# Patient Record
Sex: Female | Born: 1975 | Race: White | Hispanic: No | Marital: Married | Smoking: Never smoker
Health system: Southern US, Community
[De-identification: ages and names within clinical notes are randomized; demographics above are authoritative.]

## PROBLEM LIST (undated history)

## (undated) DIAGNOSIS — G43909 Migraine, unspecified, not intractable, without status migrainosus: Secondary | ICD-10-CM

## (undated) DIAGNOSIS — F419 Anxiety disorder, unspecified: Secondary | ICD-10-CM

## (undated) DIAGNOSIS — N809 Endometriosis, unspecified: Secondary | ICD-10-CM

## (undated) DIAGNOSIS — G8929 Other chronic pain: Secondary | ICD-10-CM

## (undated) DIAGNOSIS — N200 Calculus of kidney: Secondary | ICD-10-CM

## (undated) DIAGNOSIS — Z87898 Personal history of other specified conditions: Secondary | ICD-10-CM

## (undated) DIAGNOSIS — M797 Fibromyalgia: Secondary | ICD-10-CM

## (undated) DIAGNOSIS — M1990 Unspecified osteoarthritis, unspecified site: Secondary | ICD-10-CM

## (undated) HISTORY — PX: VAGINAL HYSTERECTOMY: SUR661

## (undated) HISTORY — DX: Other chronic pain: G89.29

## (undated) HISTORY — PX: APPENDECTOMY: SHX54

## (undated) HISTORY — PX: CHOLECYSTECTOMY: SHX55

## (undated) HISTORY — DX: Personal history of other specified conditions: Z87.898

---

## 1997-07-06 ENCOUNTER — Other Ambulatory Visit: Admission: RE | Admit: 1997-07-06 | Discharge: 1997-07-06 | Payer: Self-pay | Admitting: *Deleted

## 1998-12-06 ENCOUNTER — Emergency Department (HOSPITAL_COMMUNITY): Admission: EM | Admit: 1998-12-06 | Discharge: 1998-12-06 | Payer: Self-pay | Admitting: Emergency Medicine

## 1999-03-23 ENCOUNTER — Encounter: Payer: Self-pay | Admitting: Family Medicine

## 1999-03-23 ENCOUNTER — Encounter: Admission: RE | Admit: 1999-03-23 | Discharge: 1999-03-23 | Payer: Self-pay | Admitting: Family Medicine

## 1999-11-16 ENCOUNTER — Encounter: Admission: RE | Admit: 1999-11-16 | Discharge: 1999-11-16 | Payer: Self-pay | Admitting: Internal Medicine

## 1999-11-16 ENCOUNTER — Encounter: Payer: Self-pay | Admitting: Internal Medicine

## 1999-12-10 ENCOUNTER — Ambulatory Visit (HOSPITAL_COMMUNITY): Admission: RE | Admit: 1999-12-10 | Discharge: 1999-12-10 | Payer: Self-pay | Admitting: Gastroenterology

## 2000-06-29 ENCOUNTER — Emergency Department (HOSPITAL_COMMUNITY): Admission: EM | Admit: 2000-06-29 | Discharge: 2000-06-30 | Payer: Self-pay | Admitting: Emergency Medicine

## 2001-05-25 ENCOUNTER — Encounter: Admission: RE | Admit: 2001-05-25 | Discharge: 2001-05-25 | Payer: Self-pay | Admitting: Specialist

## 2001-05-25 ENCOUNTER — Encounter: Payer: Self-pay | Admitting: Specialist

## 2001-07-23 ENCOUNTER — Other Ambulatory Visit: Admission: RE | Admit: 2001-07-23 | Discharge: 2001-07-23 | Payer: Self-pay | Admitting: Obstetrics and Gynecology

## 2001-12-02 ENCOUNTER — Inpatient Hospital Stay (HOSPITAL_COMMUNITY): Admission: RE | Admit: 2001-12-02 | Discharge: 2001-12-03 | Payer: Self-pay | Admitting: Obstetrics and Gynecology

## 2001-12-02 ENCOUNTER — Encounter (INDEPENDENT_AMBULATORY_CARE_PROVIDER_SITE_OTHER): Payer: Self-pay

## 2001-12-07 ENCOUNTER — Inpatient Hospital Stay (HOSPITAL_COMMUNITY): Admission: AD | Admit: 2001-12-07 | Discharge: 2001-12-07 | Payer: Self-pay | Admitting: Obstetrics and Gynecology

## 2002-05-10 ENCOUNTER — Encounter: Payer: Self-pay | Admitting: Emergency Medicine

## 2002-05-10 ENCOUNTER — Emergency Department (HOSPITAL_COMMUNITY): Admission: EM | Admit: 2002-05-10 | Discharge: 2002-05-11 | Payer: Self-pay | Admitting: Emergency Medicine

## 2002-06-23 ENCOUNTER — Emergency Department (HOSPITAL_COMMUNITY): Admission: EM | Admit: 2002-06-23 | Discharge: 2002-06-23 | Payer: Self-pay | Admitting: Internal Medicine

## 2002-06-23 ENCOUNTER — Encounter: Payer: Self-pay | Admitting: Internal Medicine

## 2002-11-19 ENCOUNTER — Emergency Department (HOSPITAL_COMMUNITY): Admission: EM | Admit: 2002-11-19 | Discharge: 2002-11-19 | Payer: Self-pay | Admitting: Emergency Medicine

## 2002-11-19 ENCOUNTER — Encounter: Payer: Self-pay | Admitting: Emergency Medicine

## 2003-04-10 ENCOUNTER — Encounter: Admission: RE | Admit: 2003-04-10 | Discharge: 2003-04-10 | Payer: Self-pay | Admitting: Family Medicine

## 2003-05-02 ENCOUNTER — Inpatient Hospital Stay (HOSPITAL_COMMUNITY): Admission: EM | Admit: 2003-05-02 | Discharge: 2003-05-05 | Payer: Self-pay | Admitting: Emergency Medicine

## 2003-05-08 ENCOUNTER — Emergency Department (HOSPITAL_COMMUNITY): Admission: EM | Admit: 2003-05-08 | Discharge: 2003-05-08 | Payer: Self-pay | Admitting: Emergency Medicine

## 2003-07-06 ENCOUNTER — Other Ambulatory Visit: Admission: RE | Admit: 2003-07-06 | Discharge: 2003-07-06 | Payer: Self-pay | Admitting: Obstetrics and Gynecology

## 2003-08-04 ENCOUNTER — Emergency Department (HOSPITAL_COMMUNITY): Admission: EM | Admit: 2003-08-04 | Discharge: 2003-08-04 | Payer: Self-pay | Admitting: Emergency Medicine

## 2004-03-23 ENCOUNTER — Ambulatory Visit (HOSPITAL_COMMUNITY): Admission: RE | Admit: 2004-03-23 | Discharge: 2004-03-23 | Payer: Self-pay | Admitting: Family Medicine

## 2004-04-04 ENCOUNTER — Ambulatory Visit: Payer: Self-pay | Admitting: Orthopedic Surgery

## 2004-04-13 ENCOUNTER — Encounter (HOSPITAL_COMMUNITY): Admission: RE | Admit: 2004-04-13 | Discharge: 2004-05-13 | Payer: Self-pay | Admitting: Orthopedic Surgery

## 2004-12-10 ENCOUNTER — Ambulatory Visit (HOSPITAL_COMMUNITY): Admission: RE | Admit: 2004-12-10 | Discharge: 2004-12-10 | Payer: Self-pay | Admitting: Family Medicine

## 2004-12-12 ENCOUNTER — Ambulatory Visit (HOSPITAL_COMMUNITY): Admission: RE | Admit: 2004-12-12 | Discharge: 2004-12-12 | Payer: Self-pay | Admitting: Family Medicine

## 2004-12-20 ENCOUNTER — Ambulatory Visit: Payer: Self-pay | Admitting: Internal Medicine

## 2004-12-21 ENCOUNTER — Ambulatory Visit: Payer: Self-pay | Admitting: Internal Medicine

## 2004-12-21 ENCOUNTER — Ambulatory Visit (HOSPITAL_COMMUNITY): Admission: RE | Admit: 2004-12-21 | Discharge: 2004-12-21 | Payer: Self-pay | Admitting: Obstetrics and Gynecology

## 2005-02-13 ENCOUNTER — Ambulatory Visit: Payer: Self-pay | Admitting: Internal Medicine

## 2005-05-01 ENCOUNTER — Other Ambulatory Visit: Admission: RE | Admit: 2005-05-01 | Discharge: 2005-05-01 | Payer: Self-pay | Admitting: Obstetrics and Gynecology

## 2005-05-17 ENCOUNTER — Encounter: Admission: RE | Admit: 2005-05-17 | Discharge: 2005-05-17 | Payer: Self-pay | Admitting: Obstetrics and Gynecology

## 2005-08-02 ENCOUNTER — Emergency Department (HOSPITAL_COMMUNITY): Admission: EM | Admit: 2005-08-02 | Discharge: 2005-08-03 | Payer: Self-pay | Admitting: Emergency Medicine

## 2006-08-27 ENCOUNTER — Ambulatory Visit (HOSPITAL_COMMUNITY): Admission: RE | Admit: 2006-08-27 | Discharge: 2006-08-27 | Payer: Self-pay | Admitting: Family Medicine

## 2007-02-11 ENCOUNTER — Emergency Department (HOSPITAL_COMMUNITY): Admission: EM | Admit: 2007-02-11 | Discharge: 2007-02-11 | Payer: Self-pay | Admitting: Emergency Medicine

## 2007-02-25 ENCOUNTER — Emergency Department (HOSPITAL_COMMUNITY): Admission: EM | Admit: 2007-02-25 | Discharge: 2007-02-25 | Payer: Self-pay | Admitting: Emergency Medicine

## 2007-08-14 ENCOUNTER — Emergency Department (HOSPITAL_COMMUNITY): Admission: EM | Admit: 2007-08-14 | Discharge: 2007-08-14 | Payer: Self-pay | Admitting: Emergency Medicine

## 2007-10-15 ENCOUNTER — Emergency Department (HOSPITAL_COMMUNITY): Admission: EM | Admit: 2007-10-15 | Discharge: 2007-10-15 | Payer: Self-pay | Admitting: Emergency Medicine

## 2007-10-16 ENCOUNTER — Ambulatory Visit: Payer: Self-pay | Admitting: Internal Medicine

## 2007-10-17 ENCOUNTER — Emergency Department (HOSPITAL_COMMUNITY): Admission: EM | Admit: 2007-10-17 | Discharge: 2007-10-17 | Payer: Self-pay | Admitting: Emergency Medicine

## 2007-10-19 ENCOUNTER — Inpatient Hospital Stay (HOSPITAL_COMMUNITY): Admission: EM | Admit: 2007-10-19 | Discharge: 2007-10-22 | Payer: Self-pay | Admitting: Emergency Medicine

## 2007-10-20 ENCOUNTER — Ambulatory Visit: Payer: Self-pay | Admitting: Gastroenterology

## 2007-10-21 ENCOUNTER — Encounter (INDEPENDENT_AMBULATORY_CARE_PROVIDER_SITE_OTHER): Payer: Self-pay | Admitting: General Surgery

## 2008-05-12 ENCOUNTER — Ambulatory Visit (HOSPITAL_COMMUNITY): Admission: RE | Admit: 2008-05-12 | Discharge: 2008-05-12 | Payer: Self-pay | Admitting: Family Medicine

## 2008-10-29 ENCOUNTER — Emergency Department (HOSPITAL_COMMUNITY): Admission: EM | Admit: 2008-10-29 | Discharge: 2008-10-29 | Payer: Self-pay | Admitting: Family Medicine

## 2008-11-29 IMAGING — NM NM HEPATO W/GB/PHARM/[PERSON_NAME]
2 series · 12 of 12 positions shown · non-contrast
Comparison: CT 10/17/2007

CLINICAL DATA: Abdominal pain

NUCLEAR MEDICINE HEPATOBILIARY IMAGING WITH GALLBLADDER EF
TECHNIQUE: Sequential images of the abdomen were obtained [DATE] minutes following intravenous administration of
radiopharmaceutical. After oral ingestion of 8oz of half and half
cream, gallbladder ejection fraction was determined.
Radiopharmaceutical:  5 mCi technetium Choletec.mCi Fc-00m Choletec

[Series 1: gb hepatobiliary scan · 3.27mm/px · 6 of 60 frames shown (1 of 2)]
[frame 6/60]
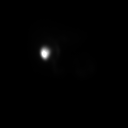
[frame 16/60]
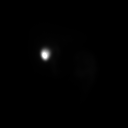
[frame 26/60]
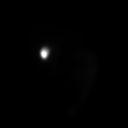
[frame 36/60]
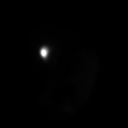
[frame 46/60]
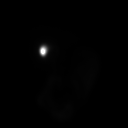
[frame 56/60]
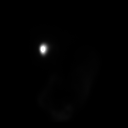

[Series 1: gb hepatobiliary scan · 3.27mm/px · 6 of 52 frames shown (2 of 2)]
[frame 5/52]
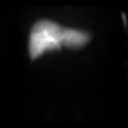
[frame 13/52]
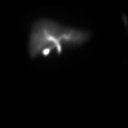
[frame 22/52]
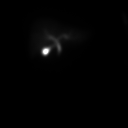
[frame 31/52]
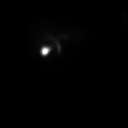
[frame 39/52]
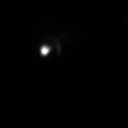
[frame 48/52]
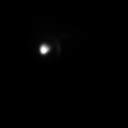

[12 of 12 positions shown; findings below may reference images not displayed]

FINDINGS: There is prompt uptake and excretion of radiotracer by
the liver.  Activity is seen in the gallbladder at 10 minutes.
Small bowel activity noted prior to half-and-half administration.
After administration of half-and-half, estimated gallbladder
ejection fraction is 21.8%.  Normal is greater than 50%.
IMPRESSION: No evidence of cystic duct or common bile duct obstruction.

Significantly diminished gallbladder ejection fraction.

## 2010-03-01 ENCOUNTER — Inpatient Hospital Stay (HOSPITAL_COMMUNITY)
Admission: RE | Admit: 2010-03-01 | Discharge: 2010-03-03 | Payer: Self-pay | Source: Home / Self Care | Attending: Psychiatry | Admitting: Psychiatry

## 2010-04-01 ENCOUNTER — Encounter: Payer: Self-pay | Admitting: Family Medicine

## 2010-04-02 ENCOUNTER — Encounter: Payer: Self-pay | Admitting: Internal Medicine

## 2010-04-02 ENCOUNTER — Encounter: Payer: Self-pay | Admitting: Physical Medicine and Rehabilitation

## 2010-05-21 LAB — COMPREHENSIVE METABOLIC PANEL
ALT: 14 U/L (ref 0–35)
AST: 13 U/L (ref 0–37)
Albumin: 3.9 g/dL (ref 3.5–5.2)
Alkaline Phosphatase: 42 U/L (ref 39–117)
BUN: 13 mg/dL (ref 6–23)
CO2: 27 mEq/L (ref 19–32)
Calcium: 9.4 mg/dL (ref 8.4–10.5)
Chloride: 105 mEq/L (ref 96–112)
Creatinine, Ser: 0.72 mg/dL (ref 0.4–1.2)
GFR calc Af Amer: >60 mL/min (ref >60)
GFR calc non Af Amer: >60 mL/min (ref >60)
Glucose, Bld: 85 mg/dL (ref 70–99)
Potassium: 4.4 mEq/L (ref 3.5–5.1)
Sodium: 140 mEq/L (ref 135–145)
Total Bilirubin: 0.5 mg/dL (ref 0.3–1.2)
Total Protein: 6.6 g/dL (ref 6.0–8.3)

## 2010-05-21 LAB — CBC
HCT: 40.9 % (ref 36.0–46.0)
Hemoglobin: 13.8 g/dL (ref 12.0–15.0)
MCH: 30.3 pg (ref 26.0–34.0)
MCHC: 33.7 g/dL (ref 30.0–36.0)
MCV: 89.7 fL (ref 78.0–100.0)
Platelets: 255 10*3/uL (ref 150–400)
RBC: 4.56 MIL/uL (ref 3.87–5.11)
RDW: 11.9 % (ref 11.5–15.5)
WBC: 9.9 10*3/uL (ref 4.0–10.5)

## 2010-05-21 LAB — TSH: TSH: 3.71 u[IU]/mL (ref 0.350–4.500)

## 2010-07-24 NOTE — Op Note (Signed)
NAMEZALIYAH, MEIKLE NO.:  [id number]   MEDICAL RECORD NO.:  [id number]          PATIENT TYPE:  INP   LOCATION:  A312                          FACILITY:  [facility]   PHYSICIAN:  Barbaraann Barthel, M.D. DATE OF BIRTH:  11/29/1975   DATE OF PROCEDURE:  10/21/2007  DATE OF DISCHARGE:                               OPERATIVE REPORT   SURGEON:  Barbaraann Barthel, MD   PREOPERATIVE DIAGNOSIS:  Cholecystitis secondary to cholelithiasis.   POSTOPERATIVE DIAGNOSIS:  Cholecystitis secondary to cholelithiasis.   PROCEDURE:  Laparoscopic cholecystectomy.   SPECIMEN:  Gallbladder.   NOTE:  This is a 35 year old white female who had recurrent episodes of  right upper quadrant pain postprandial in nature accompanied with  nausea, vomiting, and bloating.  She was seen in the emergency room on 3  different occasions.  We discussed surgery for her after she was  admitted to the Medical Service and she had also had a GI consult.  We  discussed surgery in detail discussing complications not limited to, but  including bleeding, infection, damage to bile ducts, perforation of  organs, transitory diarrhea, and the possibility that an open procedure  might be required.  Informed consent was obtained.   GROSS OPERATIVE FINDINGS:  Those consistent with a mildly inflamed  gallbladder with some edema, a normal size cystic duct which was not  cannulated, and the rest of the right upper quadrant grossly appeared  normal laparoscopically.   TECHNIQUE:  The patient was placed in supine position after the adequate  administration of general anesthesia via endotracheal intubation.  Her  entire abdomen was prepped with Betadine solution and draped in usual  manner.  Prior to this, a Foley catheter was aseptically inserted.  After prepping and draping a periumbilical incision was carried out over  the superior aspect of the umbilicus.  The patient was placed in the  Trendelenburg position.  The  fascia was grasped with a sharp towel clip  and elevated.  A Veress needle was inserted and confirmed the position  with a saline drop test and the abdomen was then insufflated with  approximately 3.5 liters of CO2.  Using a 11-mm cannula and Visiport  technique, the cannula was placed in the area of the umbilicus and then  under direct vision, a 11 mm cannula was placed in the epigastrium and  two 5 mm cannulas in right upper quadrant laterally.  The gallbladder  was grasped.  Its adhesions were taken down.  The cystic duct was  clearly visualized, triply silver clipped on the side of the common bile  duct, singly silver clipped on the side of the gallbladder, and divided  as was the cystic artery.  There was no bleeding or leaking.  The  gallbladder was then grasped with further traction and removed from the  liver bed using the hook cautery device.  There was minimal oozing.  The  liver bed was cauterized.  We then removed the gallbladder through the  epigastric incision using the Endosac device.  We then irrigated,  checked for bleeding, and then I elected to leave a piece  of Surgicel on  the liver bed and a drain which was exited from the lateral-most  incision.  We then desufflated the abdomen, closed the fascia in the  area of the umbilicus, and the epigastrium using O Polysorb and then  closed all skin incisions with a surgical clips.  We used 0.5%  Sensorcaine at all port sites for postoperative comfort.  The drain was  sutured in place with 3-0 nylon and prior to closure, all sponge,  needle, and instrument counts found to be correct.   ESTIMATED BLOOD LOSS:  Minimal.   The patient received 800 mL of crystalloids intraoperatively.  Wound  designation was cleaned, contaminated, and there was no spillage of  bile.      Barbaraann Barthel, M.D.  Electronically Signed     WB/MEDQ  D:  10/21/2007  T:  10/22/2007  Job:  [id number]   cc:   [facility] Team   GI Service

## 2010-07-24 NOTE — Consult Note (Signed)
NAMESAADIA, DEWITT NO.:  [id number]   MEDICAL RECORD NO.:  [id number]          PATIENT TYPE:  INP   LOCATION:  A312                          FACILITY:  [facility]   PHYSICIAN:  Barbaraann Barthel, M.D. DATE OF BIRTH:  1975-05-25   DATE OF CONSULTATION:  10/20/2007  DATE OF DISCHARGE:                                 CONSULTATION   Surgery was asked to see his 35 year old white female for recurrent  abdominal pain.   CHIEF COMPLAINT:  Epigastric and right upper quadrant pain with nausea  and vomiting.   HISTORY OF PRESENT MEDICAL ILLNESS:  The patient states that she has had  these symptoms on and off for the last several years with the most  recent flare up being this weekend after eating greasy food.  She,  during this weekend, had three visits to the emergency room, the last  which she was admitted to [facility].  Surgery was consulted  after being seen by the GI service.  The patient had a sonogram which  did not reveal stones; however, she had a hepatobiliary scan which  showed a 20% ejection fraction with recurrence of her symptoms.  Surgery  was asked to see regarding laparoscopic cholecystectomy for biliary  dyskinesia.   PHYSICAL EXAMINATION:  GENERAL:  A pleasant 35 year old white female who  is in no acute distress.  She is uncomfortable but otherwise doing well.  VITAL SIGNS:  She is afebrile with a temperature of 97.9, blood pressure  is 112/69, heart rate 73, respirations are 20.  HEENT:  Head is normocephalic.  Eyes - extraocular movements are intact.  Pupils are round and react to light and accommodation.  There is no  conjunctive pallor or scleral injection.  Sclerae is normal tincture.  Nose and oral mucosa are moist.  NECK:  Supple and cylindrical without jugular vein distention,  thyromegaly, tracheal deviation or cervical adenopathy.  No bruits are  auscultated.  CHEST:  Clear, both to anterior and posterior auscultation.  HEART:   Regular rhythm.  BREASTS/AXILLA:  Without masses.  ABDOMEN:  The patient is tender in the epigastrium and in the right  upper quadrant.  She has a feeling of some bloating.  No masses are  appreciated.  RECTAL:  Guaiac-negative stool.  No inguinal hernias are appreciated.  EXTREMITIES:  The patient has had previous left cubital nerve release at  her left elbow.   REVIEW OF SYSTEMS:  GI SYMPTOMS:  The patient has nausea, vomiting and  recurrent epigastric pain that has not been improved by the use of  antacids.  Sonogram revealed no stones.  It was otherwise normal.  Hepatobiliary scan showed a diminished ejection fraction with recurrence  of her symptoms, and the CT scan is also grossly within normal limits;  she has had a recent CT scan.  She has no past history of hepatitis.  She may have had a history of irritable bowel syndrome, although it does  not sound like this.  She had no history of diarrhea.  She is more  chronically constipated.  She has had no bright red  rectal bleeding,  black tarry stools or unexplained weight loss.  She has had an upper GI  endoscopy in the past.  GU SYSTEM:  She says she may have had been  diagnosed with kidney stones in the past.  However, she has no dysuria  at present.  ENDOCRINE SYSTEM:  No history of diabetes or thyroid  disease.  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  A questionable  history of some fibromyalgia.  NEUROLOGICAL SYSTEM:  Grossly within  normal limits on examination.  The patient has a history of seizure  disorders, the cause of which were never determined.  She has not had a  seizure in the last 5 years.  She also has a history of migraines.  CARDIOVASCULAR SYSTEM:  Grossly within normal limits.  The patient does  not smoke or drink.   OBSTETRIC AND GYNECOLOGIC HISTORY:  She is a gravida 2, para 2, abortus  zero, cesarean zero female.  No family history of  breast carcinoma and a  mammogram performed a year ago which was normal.  She has had a vaginal  hysterectomy.   MEDICATIONS:  1. Keppra 2000 mg b.i.d.  2. Lyrica 75 mg t.i.d.  (She was taking AcipHex earlier daily.)  3. Imitrex 100 mg b.i.d.  4. Xanax extended release daily as needed.  5. Protonix 40 mg daily.  6. Bentyl t.i.d. as needed.  7. Align daily.   ALLERGIES:  She states that she is allergic to:  1. LATEX.  2. MORPHINE.  3. CODEINE.  4. STADOL.  5. DILAUDID.   IMPRESSION:  Ms. 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  Her symptoms have  certainly been long term and recurrent in this aspect.  She has other  somatic problems as mentioned above; however, I believe that she is  having this problem.  We discussed surgery with her in detail,  discussing complications, not limited to but including, bleeding,  infection, damage to bile ducts, perforation of organs and transitory  diarrhea, and the possibility that open surgery might be required.  We  also discussed that the results for cholecystectomy for biliary  dyskinesia are not as satisfying as for the results of cholecystectomy  for cholelithiasis.  Informed consent was obtained.  I also stated the  patient's family could call me, and I gave them my card should they have  any questions.  We will plan for surgery in the morning, and I have  discussed this with the medical service and, following agreement, we  will plan for surgery in the a.m.   REST OF LABORATORY DATA:  The patient has a white count of 7.3 with an  hemoglobin and hematocrit of 11.4 and 34.2 with 198,000 platelets,  normal differential.  Blood sugars are 85, BUN is 9 with a creatinine of  0.72.  Liver function studies and amylase are within normal limits.   PAST SURGICAL HISTORY:  1. Left elbow surgery for nerve entrapment.  2. Vaginal hysterectomy.  3. Appendectomy.   We will plan  for surgery in the morning if all are in agreement.   Thank you for the consultation.      Barbaraann Barthel, M.D.  Electronically Signed     WB/MEDQ  D:  10/20/2007  T:  10/20/2007  Job:  [id number]   cc:   [facility] Team

## 2010-07-24 NOTE — Discharge Summary (Signed)
[name], KAIN              ACCOUNT NO.:  [id number]   MEDICAL RECORD NO.:  [id number]          PATIENT TYPE:  INP   LOCATION:  A312                          FACILITY:  [facility]   PHYSICIAN:  Osvaldo Shipper, MD     DATE OF BIRTH:  Apr 10, 1975   DATE OF ADMISSION:  10/19/2007  DATE OF DISCHARGE:  08/13/2009LH                               DISCHARGE SUMMARY   HISTORY OF PRESENT ILLNESS:  Please review history and physical dictated  by Dr. Skeet Latch for details regarding the patient's presenting  illness.   PRIMARY CARE PHYSICIAN:  [facility].   DISCHARGE DIAGNOSES:  1. Abdominal pain, likely secondary to biliary dyskinesia status post      cholecystectomy.  2. History of seizure disorder, stable.  3. History of fibromyalgia, stable.  4. History of migraines and panic attacks, stable.   HOSPITAL COURSE:  Briefly, this is a 35 year old Caucasian female who  presented with complaints of abdominal pain.  The patient was seen in  the emergency department and she underwent  initially an ultrasound of  the abdomen which did not show any abnormalities.  She subsequently  underwent a CT scan of the abdomen and pelvis which showed a prominence  CBD, but no other acute findings.  A CT scan of the pelvis did show a  small ovarian cyst.  The patient's liver function tests were within the  normal range.  The patient subsequently underwent a HIDA scan which  showed significantly diminished gallbladder EF of 21%.  Based on these  findings, general surgery was consulted and the patient underwent  laparoscopic cholecystectomy on October 21, 2007.  During the surgery,  gallbladder was noted to be mildly inflamed with some edema.  The  patient has done well postoperatively.  This morning Dr. Malvin Johns saw  her and the wounds appeared to be quite clean with no evidence of  infection.  She did have a slight temperature this morning at 99.4 and  on recheck was 99.8.  The patient is  denying any other complaints apart  from mild abdominal discomfort.  No cough is present. No urinary  discomfort is present.  The rest of her medical issues are quite stable  with no seizure activity.   Apart from the abnormal temperature,  no other significant abnormalities  are present.  I did not palpate the abdomen as the patient refused and  she was just seen a few minutes ago by Dr. Malvin Johns who removed the JP  drain.  He has cleared her for discharge.  I have explained to the  patient that she can take Tylenol as needed for slightly elevated  temperatures.  I have told her that if she experiences chills or if the  temperature goes above 101 and not easily controlled with Tylenol she  needs to seek attention immediately.   DISCHARGE MEDICATIONS:  1. Darvocet-N 100 one tablet q.4-6h. as needed.  Dr. Malvin Johns has      written a prescription for #20 tablets.  2. Tylenol as needed, not to exceed 3 to 4 pills every day.  3. Continue with  Keppra 2,000 mg b.i.d.  4. Lyrica 75 mg t.i.d.  5. Imitrex 100 mg b.i.d.  6. Xanax XR daily as needed.  7. Protonix 40 mg daily.  8. Bentyl as needed.  9. Align, she was taking this four times a day.   No aspirin products.   FOLLOWUP:  Follow up with Dr. Malvin Johns on November 02, 2007 at 3:30 P.M.  Followup with her PMD as needed.  Other instructions as above.   DIET:  Full liquids or soft diet for the next few days and then she may  advance as tolerated.   WOUND CARE:  Wound care was instructed by Dr. Malvin Johns.   Total time of this discharge encounter about 35 minutes.      Osvaldo Shipper, MD  Electronically Signed     GK/MEDQ  D:  10/22/2007  T:  10/22/2007  Job:  [id number]   cc:   Barbaraann Barthel, M.D.  Fax: [phone]   Patrica Duel, M.D.  Fax: [phone]

## 2010-07-24 NOTE — Consult Note (Signed)
NAME:  [name], [name]              ACCOUNT NO.:  [id number]   MEDICAL RECORD NO.:  [id number]          PATIENT TYPE:  INP   LOCATION:  A312                          FACILITY:  [facility]   PHYSICIAN:  Kassie Mends, M.D.      DATE OF BIRTH:  1976/03/09   DATE OF CONSULTATION:  DATE OF DISCHARGE:                                 CONSULTATION   REQUESTING PHYSICIAN:  [facility], P Team   REASON FOR CONSULTATION:  Right upper quadrant epigastric pain, nausea,  vomiting, and diarrhea.   HISTORY OF PRESENT ILLNESS:  [name] is a 36 year old Caucasian  female.  She has had a 6-day history of abdominal pain, nausea, and  vomiting.  She was actually seen by me in consultation on October 16, 2007.  She had previously been to the emergency room prior to our  appointment.  She has also been to the emergency room twice since the  appointment and if you please refer to my October 16, 2007, note.  She  continued to have severe pain that is usually worse postprandially.  It  is mostly in the epigastrium and right upper quadrant.  It does radiate  through to the back.  She complains of severe bloating when she eats.  She also has had intermittent diarrhea anywhere from 1-3 loose stools  per day, it has been nonbloody recently, but she does have history of  hematochezia previously.  Since consultation with me, she was set up to  have a HIDA scan, but the pain became so severe she reported back to the  emergency room, and she was admitted to the hospital.  Subsequently,  underwent a HIDA scan which showed a gallbladder ejection fraction of  21.8%.  She did have reproduction of symptoms with Half-and-Half, which  included severe excruciating epigastric right upper quadrant pain which  radiate to her back and right shoulder as well as severe nausea.  She  does have underlying history of IBS, and we will need a colonoscopy for  hematochezia at some point.  She tells me her symptoms are worse with  greasy  foods.  She has noted some heartburn and indigestion recently, as  well workup thus far have included an abdominal ultrasound which showed  mildly prominent CBD.  There were no intrahepatic biliary dilatation was  identified.  She had a normal gallbladder without stones or wall  thickening on this study.  She had a CT of the abdomen and pelvis with  contrast on October 17, 2007, which showed prominent CBD which was stable  compared to previous CT from May 2007 and a 2.5 cm left ovarian cyst.  She had stool studies through our office as ova, parasites, and culture  and sensitivity are pending.  She had a negative C. diff, negative fecal  occult blood test, and a positive lactoferrin.   PAST MEDICAL/SURGICAL HISTORY:  1. IBS.  2. Fibromyalgia.  3. Seizure disorder, her last seizure was about 4 years ago.  4. Anxiety.  5. Migraine headaches.  6. Hot flashes.  7. Partial hysterectomy.  8. Appendectomy.  9. Left elbow  surgery.   MEDICATIONS:  Prior to admission;  1. Keppra 2000 mg b.i.d.  2. Lyrica 25 mg daily.  3. Imitrex 100 mg b.i.d.  4. Xanax XR unknown dose daily.  5. Protonix 40 mg daily.  6. Bentyl 10 mg t.i.d. p.r.n.  7. Alinia once daily.   ALLERGIES:  1. MORPHINE.  2. CODEINE.  3. STADOL.  4. LATEX.  5. SEPTRA.  6. DILAUDID.   FAMILY HISTORY:  Father had pancreatic carcinoma.  He had cirrhosis as  well as alcoholic.  He died at age 54.  Her mother is age 87 and  healthy.  She is an only child.   SOCIAL HISTORY:  She is married.  She has a 63 and 37 year old at home.  She works as a Audiological scientist.  She has never been a smoker, drink  any heavy alcohol, or used any drugs.   REVIEW OF SYSTEMS:  See HPI, otherwise negative.   PHYSICAL EXAMINATION:  VITAL SIGNS:  Weight 55 kg, height 63 inches,  temp 97.9, pulse 73, respirations 20, blood pressure 112/69, and O2 sat  98% on room air.GENERAL:  She is a well-developed, well-nourished  Caucasian female in no acute  distress.HEENT:  Sclerae clear, nonicteric.  Conjunctivae pink.  Oropharynx pink and moist without any lesions.NECK:  Supple without mass or thyromegaly.  CHEST:  Heart regular rate and rhythm.  Normal S1 and S2 without  murmurs, clicks, rubs, or gallop.LUNGS:  Clear to auscultation  bilaterally.ABDOMEN:  Positive bowel sounds x4.  No bruits auscultated.  Soft and nondistended.  She does have Murphy's point tenderness.  She  has significant right upper quadrant tenderness and tenderness around  the epigastrium on deep palpation.  There is no rebound tenderness or  guarding.  No hepatosplenomegaly or mass.EXTREMITIES:  Without clubbing  or edema.RECTAL:  Shows some external hemorrhoids as well as palpable  internal hemorrhoids.  There is no internal masses palpated, small  amount of hemoccult negative, and brown stool was obtained from the  vault.EXTREMITIES:  Without clubbing or edema.   LABORATORY DATA:  WBC, 7.3, hemoglobin 11.4, hematocrit 34.2, and  platelets 198.  Calcium 8.3, sodium 140, potassium 3.4, chloride 111,  CO2 of 24, BUN 9, creatinine 0.72, and glucose 85.  Urinalysis, positive  for glucose.   IMPRESSION:  [name] is 35 year old female with acute  epigastric/right upper quadrant pain and is postprandial.  HIDA scan was  positive.  It does reproduce her pain and nausea.  I suspect chronic  acalculous gallbladder disease is culprit of her pain, does not explain  diarrhea, however, or hematochezia, so the patient will need colonoscopy  +/- EGD and a later date if she remains with nausea.  She does have  underlying irritable bowel syndrome and she has a mild anemia, which I  suspect is delusional given her IV fluids.   PLAN:  1. Agree with surgical consult for cholecystectomy.  2. Agree with pain control/antiemetics.  3. Remain on PPI.  4. Will follow up stool studies.   I would like to thank the [facility] P Team for allowing [place] to  participate in the care of Ms.  Case.      [name], N.P.      Kassie Mends, M.D.  Electronically Signed    KJ/MEDQ  D:  10/20/2007  T:  10/20/2007  Job:  [id number]   cc:   Patrica Duel, M.D.  Fax: [phone]

## 2010-07-24 NOTE — Consult Note (Signed)
[name], [name]              ACCOUNT NO.:  [id number]   MEDICAL RECORD NO.:  [id number]          PATIENT TYPE:  EMS   LOCATION:  ED                            FACILITY:  [facility]   PHYSICIAN:  [name], N.P.    DATE OF BIRTH:  Apr 02, 1975   DATE OF CONSULTATION:  DATE OF DISCHARGE:  10/15/2007                                 CONSULTATION   REQUESTING PHYSICIAN:  Kelsey Lerner. Margretta Ditty, MD   PRIMARY CARE PHYSICIAN:  Kelsey Duel, MD   CHIEF COMPLAINT:  Abdominal pain and chronic diarrhea and hematochezia.   HISTORY OF PRESENT ILLNESS:  [name] is a 35 year old Caucasian  female.  She has had a bout of severe epigastric pain, it started 2 days  ago in the evening when she was at work.  She described the pain  initially was a hunger pain, it became more and more severe.  Pain was  rated at 10/10 on pain scale.  She presented to the emergency department  on October 15, 2007.  She had abdominal ultrasound, which showed mildly  prominent common bile duct though no intrahepatic biliary dilatation  identified.  She had a normal gallbladder without stones or wall  thickening.  She had a normal UA, CBC, negative HCG, MET-7, and LFTs.  She has continued to have epigastric pain.  She is taking Darvocet as  needed for pain control.  She has been using a heating pad.  She was on  Protonix years ago.  Approximately 5 months ago, she was treated for a  bacterial infection as she and everyone in the household had diarrhea.  She was given 10 days worth of Flagyl.  She continued to have anywhere  between 6 and 8 bowel movements daily that are loose and watery.  She  has at times seen bright red blood in small to moderate amounts with  wiping.  She feels this is attributed to her hemorrhoids.  She denies  any melena.  Denies any fever.  Denies any chills.  She tells me she has  vomiting every day.  Her epigastric pain and vomiting is made worse  after she eats, she tells me, I always acid in my  stomach.  She  complains of water brash throughout the day.  She tells me she had an  EGD by Dr. Kinnie Case approximately 5 or 6 years ago, which was benign per  her report.  She has never had a colonoscopy.  She reports 11-pound  weight loss due to anorexia and all of her symptoms.  She tells me she  is afraid to eat in the past 6 months or so.  She denies any NSAID use.   PAST MEDICAL AND SURGICAL HISTORY:  Fibromyalgia, seizure disorder with  last seizure about 4 years ago, anxiety, migraine headaches, hot  flashes.  She has been told she had IBS, it was mostly constipation  predominant up until this point.  She has had a partial hysterectomy.  She had appendectomy and left elbow surgery.   CURRENT MEDICATIONS:  Keppra 1 g in the morning, 1 g in the evening,  Xanax XR unknown dose in the morning, Imitrex unknown dose p.r.n.,  Loestrin once daily, Lyrica 25 mg daily, vitamin D 500 units weekly, and  Darvocet-N 100 p.r.n.   ALLERGIES:  CODEINE, MORPHINE, and LATEX.   FAMILY HISTORY:  There is a family history of her father having  pancreatic cancer.  He also had cirrhosis and was an alcoholic.  He died  at age 54.  Her mother age 52 is healthy.  She was an only child.   SOCIAL HISTORY:  [name] is married.  She has an 39 and 37 year old  at home.  She works as a Audiological scientist.  She has never been a smoker  , drank any alcohol, or used drugs.   REVIEW OF SYSTEMS:  See HPI, otherwise negative.   PHYSICAL EXAM:  VITAL SIGNS:  Weight 124 pounds, height 62 inches,  temperature 98.2, blood pressure 110/70, and pulse 80.  GENERAL:  She is a well-developed, well-nourished Caucasian female in no  acute distress.  HEENT:  Sclerae clear, nonicteric.  Conjunctivae pink.  Oropharynx pink  and moist without any lesions.  NECK:  Supple without mass or thyromegaly.  CHEST:  Heart regular rhythm.  Normal S1 and S2.  LUNGS:  Clear to auscultation bilaterally.  ABDOMEN:  Positive bowel  sounds x4.  No bruits auscultated.  She does  have tenderness to the epigastrium and right upper quadrant on deep  palpation.  There is no rebound tenderness or guarding.  No  hepatosplenomegaly or mass.  EXTREMITIES:  Without clubbing or edema bilaterally.   IMPRESSION:  [name] is a 35 year old Caucasian female with a  myriad of GI concerns today.  Of most importance is figuring out the  etiology of her significant 48-hour right upper quadrant pain, nausea,  and vomiting which she may be due to gallbladder disease and warrants  further investigation.  She has had a underlying history of fibromyalgia  and irritable bowel syndrome.  Recent bacterial infection may have  caused post infectious irritable bowel syndrome; however, we cannot rule  out inflammatory bowel disease given her hematochezia and ultimately I  feel that she needs a complete GI evaluation given the chronicity of her  diarrhea and the fact that she has blood in her stools and has daily  vomiting.   PLAN:  1. I would like her to begin a line probiotics once daily.  2. Aciphex 20 mg daily.  We have given her 2 boxes of samples and a      prescription for 30 with 2 refills.  3. Full set of stool studies to include C. diff, lactoferrin,      cultures, sensitivity, ova, parasites, and hemoccult stools x3.  4. HIDA scan as soon as possible.  5. We will call her with HIDA scan results and go from there.  6. She is going to need a colonoscopy given her history of chronic      diarrhea and hematochezia and we may schedule EGD at that time if      her daily nausea and vomiting has not resolved and is not explained      by gallbladder disease.   Thank you Dr. Margretta Case for allowing [place] to participate in the care of Ms.  Case.      [name], N.P.     KJ/MEDQ  D:  10/16/2007  T:  10/17/2007  Job:  [id number]   cc:   [name], M.D.  Fax: [phone]

## 2010-07-24 NOTE — H&P (Signed)
[name], ROBBS              ACCOUNT NO.:  [id number]   MEDICAL RECORD NO.:  [id number]          PATIENT TYPE:  INP   LOCATION:  A312                          FACILITY:  [facility]   PHYSICIAN:  Skeet Latch, DO    DATE OF BIRTH:  Sep 05, 1975   DATE OF ADMISSION:  10/19/2007  DATE OF DISCHARGE:  LH                              HISTORY & PHYSICAL   CHIEF COMPLAINT:  Abdominal pain.   HISTORY OF PRESENT ILLNESS:  This is a 35 year old Caucasian female who  presents with abdominal pain for approximately 5-7 days.  Apparently,  the patient was seen by gastroenterology for her abdominal pain.  A HIDA  scan was ordered, and her gallbladder was seen to be functioning at  21.8%.  Patient states that the pain started last Wednesday while at  work and was severe in nature.  Patient states that the pain was 9/10  and continues to have pain.  The patient has been seen in the emergency  room approximately two times over the last four days and was given  multiple pain medications with only slight relief.  Patient has been  having diarrhea for over a month at this time.  States that  gastroenterology did take stool samples.  She does not know the results.   PAST MEDICAL HISTORY:  1. Fibromyalgia.  2. Migraines.  3. Endometriosis.  4. Panic attack.  5. Seizure disorder.   PAST SURGICAL HISTORY:  Hysterectomy.   SOCIAL HISTORY:  No history of smoking, alcohol, or drug abuse.  Patient  lives with spouse.   DRUG ALLERGIES:  Include MORPHINE, CODEINE, DILAUDID, and STADOL.   CURRENT HOME MEDICATIONS:  1. Keppra 2000 mg twice daily.  2. Lyrica 75 mg 3 times a day.  3. Imitrex 100 mg twice daily as needed.  4. Xanax daily as needed.  5. Protonix 40 mg daily.  6. Bentyl 3 times a day as needed.  7. Align 4 times a day.  8. Zanaflex p.r.n.   REVIEW OF SYSTEMS:  Positive for abdominal pain with diarrhea.   PHYSICAL EXAMINATION:  VITAL SIGNS:  Temperature 98.3, pulse 91,  respirations 18.   She is satting 100% on room air.  GENERAL:  She is awake and alert.  Oriented x3.  She is well-developed,  well-nourished, well-hydrated, and slightly uncomfortable.  HEENT:  Head is normocephalic and atraumatic.  No scleral icterus.  PERRLA.  EOMI.  NECK:  Supple, soft, nontender, nondistended.  CARDIOVASCULAR:  Regular rate and rhythm.  No murmurs, rubs or gallops.  LUNGS:  Clear to auscultation bilaterally.  No rales, wheezes, or  rhonchi.  ABDOMEN:  Soft.  She has generalized tenderness.  Positive epigastric  tenderness and right lower quadrant tenderness on deep palpation.  No  rebound or guarding is noted.  Positive bowel sounds.  EXTREMITIES:  No clubbing, cyanosis or edema.  NEUROLOGIC:  Cranial nerves II-XII are grossly intact.   LABS:  There are no current labs at this time.   HIDA scan showed prompt uptake in the secretion of radiotracer by the  liver activity, seen in the gallbladder 10  minutes.  Small bowel  activity noted prior to half & half administration.  After  administration of half & half, estimated gallbladder ejection fraction  was 28.8%.  Normal is greater than 50%.  There is no evidence of cystic  duct or common bile duct obstruction.  Significantly diminished  gallbladder ejection fraction.   She did have a CT of her abdomen and pelvis done on October 17, 2007.  Her  abdomen showed no acute intra-abdominal abnormalities.  The common bile  duct was prominent but appeared stable compared to prior CT.   CT of her pelvis showed no significant abnormalities.  It did show a 25  mm left ovarian cyst.   ASSESSMENT/PLAN:  1. Abdominal pain with abnormal HIDA scan:  For this, patient will be      on a pain medication and will get a gastroenterology consult at      this time.  Unsure if patient may need her gallbladder removed.      Would defer to recommendations from gastroenterology at this time.  2. History of fibromyalgia:  Patient will be placed on her home       medications.  3. History of anxiety:  She also will  be placed on her home      medications.  4. Patient will continue being on a PPI daily.  As stated, will await      gastroenterology recommendations at this time.      Skeet Latch, DO  Electronically Signed     SM/MEDQ  D:  10/19/2007  T:  10/19/2007  Job:  [id number]

## 2010-07-24 NOTE — Group Therapy Note (Signed)
[name], BIRKEL              ACCOUNT NO.:  [id number]   MEDICAL RECORD NO.:  [id number]          PATIENT TYPE:  INP   LOCATION:  A312                          FACILITY:  [facility]   PHYSICIAN:  Dorris Singh, DO    DATE OF BIRTH:  1975-05-22   DATE OF PROCEDURE:  DATE OF DISCHARGE:                                 PROGRESS NOTE   SUBJECTIVE:  The patient is seen today resting comfortably in bed.  She  is scheduled for a cholecystectomy with possible laparoscopic or open  cholecystectomy.  The patient is pleased with this because of her  prevailing symptoms.  Currently today, she is doing fine.  She has no  complaints.   OBJECTIVE:  VITAL SIGNS:  Temperature 97.8, pulse 73, respirations 20,  blood pressure 129/82.  GENERAL:  The patient is a 35 year old Caucasian female who is well-  developed, well-nourished in no acute distress.  HEART:  Regular rate and rhythm.  S1-S2.  LUNGS:  Clear to auscultation bilaterally.  No rales, wheezes or  rhonchi.  ABDOMEN:  Soft and nontender.  In the lower quadrant there is positive  right upper quadrant tenderness.  EXTREMITIES:  Positive pulses.  No edema, ecchymosis or cyanosis.   LABORATORY DATA:  Her labs for this morning; white count of 7.0,  hemoglobin 11.0, hematocrit 32.4, platelet count 200.  Sodium 141,  potassium 4.1, chloride 113, CO2 of 26, glucose 106, BUN 4, creatinine  0.66.   ASSESSMENT/PLAN:  1. Abdominal pain, possible cholecystitis.  The patient is scheduled      for a laparoscopic cholecystectomy or open cholecystectomy today.      Await pending results from this.  2. Fibromyalgia and anxiety.  The patient will continue on her home      medications and will continue to monitor and change therapy as      necessary.      Dorris Singh, DO  Electronically Signed     CB/MEDQ  D:  10/21/2007  T:  10/21/2007  Job:  [id number]

## 2010-07-24 NOTE — Assessment & Plan Note (Signed)
[facility]                                 ON-CALL NOTE   NAME:BURNETTEQuyen, Cutsforth                     MRN:          [id number]  DATE:02/25/2007                            DOB:          06-14-1975    PRIMARY CARDIOLOGIST:  Dr. Tenny Craw.   PRIMARY CARE PHYSICIAN:  Dr. Phillips Odor.   SUMMARY/HISTORY:  Ms. Formosa is a 35 year old female who called the  morning of December 17 at approximately 6:40 a.m. complaining of diffuse  bilateral leg discomfort, arm discomfort, facial discomfort, dizziness.  She does not feel right.  She feels cold to the bone.  She continued  with various complaints.  She states that she saw a cardiologist in  [place] several years ago, but she cannot tell me who the doctor was  or specifically what the findings were, except that she saw the doctor  for consultation.   On review of E-chart in the archives for [facility], she was  evaluated by Dr. Tenny Craw in December of 2006 for question that an EKG  demonstrated a possible WPW.  However, on Dr. Odessa Fleming review, this was  nonspecific.  An event loop monitor demonstrated sinus rhythm despite  symptoms of heart racing, dizziness, palpitations, and fluttering.  Dr.  Graciela Husbands, on review, as well as Dr. Tenny Craw, did not think that there was the  need for any cardiac intervention or further pursuit of her  palpitations.   Ms. Siegmann goes on to state that her OB/GYN just recently placed her  on birth control pills and this was discontinued due to various  symptoms.   She was concerned that this might be a heart problem, blood clot, or  other possible problem.   I explained to Ms. Coleson that, over the phone with her various  multiple complaints, it was difficult to determine an etiology.  Given  her multiple range of complaints, I explained to her that I did not feel  that she was having a heart attack or definite cardiac issue at this  time.  However, I also explained to her that I  could not tell her she  was not having problems with blood clots given her recent birth control  prescription.  I suggested that she contact her primary care physician  or that she proceed to the emergency room for further evaluation.  She  went on to relate more information to me that she has been seen in the  emergency room 2 to 3 times in the last several months.  I again  explained to her that I could not give her a diagnosis over the phone,  and that she should consider contacting her primary care physician or  proceeding to the nearest  emergency room for further evaluation of her complaints.  She agreed to  proceed to the emergency room for further evaluation.      Joellyn Rued, PA-C  Electronically Signed      Jonelle Sidle, MD  Electronically Signed   EW/MedQ  DD: 02/25/2007  DT: 02/25/2007  Job #: [id number]   cc:   Sherol Dade.  Tenny Craw, MD, [facility]  Corrie Mckusick, M.D.

## 2010-07-27 NOTE — Discharge Summary (Signed)
NAME:  [name], [name]                        ACCOUNT NO.:  [id number]   MEDICAL RECORD NO.:  [id number]                   PATIENT TYPE:  INP   LOCATION:  A228                                 FACILITY:  [facility]   PHYSICIAN:  Hanley Hays. Dechurch, M.D.           DATE OF BIRTH:  1975-04-28   DATE OF ADMISSION:  05/02/2003  DATE OF DISCHARGE:  05/05/2003                                HISTORY & PHYSICAL   HISTORY OF PRESENT ILLNESS:  A 35 year old female with long-standing history  of headache and history of several episodes of what are described as  seizures, presented to the emergency room with what sounded like a grand mal  seizure.  The patient had had 3 other episodes prior to this event and had  been evaluated both in [place] as well as in the [facility] ER but  never admitted.  She apparently has had 4 EEG's, all normal, as well as CT  scans which did not reveal any problems.  Her headaches tend to precede  these seizures and tend to occur after an exacerbation of the headaches.  She is a somewhat difficult historian, but after sorting out her symptoms,  it appears that she has several different types of headaches, including  daily headaches, headaches in her neck that extend up over her occiput and  then generalize, and the third type headache is left sided, severe, and  these are the ones that tend to get followed up by seizures.  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  She was started on  several other medications, though she does not recall most of them.  She was  recently on Topamax, but it caused nausea and did not like the way she felt.  She had been placed on Atenolol as well without significant improvement.  She was on Relpax which did help somewhat as well as Motrin and other over-  the-counter medications.  She could give no relationship to her menstrual  periods,  although, she feels that as status post hysterectomy and thus has  not been able to track whether these are related to menstrual cycles.  She  still has her ovaries and does note cyclical mood changes which also are  associated with her headache raising the question of menstrual migraine.  In  any event, because of the persistent nature and negative workup in the past,  she was seen in consultation by neurology.  She was loaded with Dilantin but  did not tolerate the medication.  Was switched to Keppra which she seemed to  tolerate reasonably well.  The feeling was that corresponded to her headache  exacerbations.   PAST MEDICAL HISTORY:  1. Hysterectomy.  2. No history of head injury.   SOCIAL HISTORY:  She is married.  She has two children.  No history of  tobacco or alcohol abuse.  She is employed in a International aid/development worker and feels  quite stressed.   ALLERGIES:  MORPHINE, METHADONE, LATEX GLOVES, AZITHROMYCIN CAUSES SWELLING.   FAMILY HISTORY:  No history of headaches.  Otherwise, noncontributory to  this admission.   REVIEW OF SYSTEMS:  Complains of itching.  Complains of neck pain.  Apparently had a recent MRI per her primary M.D.  Complains of vaginal  burning.  No discharge, no dyspareunia.  Denies any abdominal pain.  Complains of some nausea.  Denies weight loss.  Bowels, no complaints.  No  focal findings on review of systems.   PHYSICAL EXAMINATION:  GENERAL APPEARANCE:  A slightly overweight but  pleasant female who is alert and appropriate and able to provide reasonable  history at this point.  Complaining of diffuse left sided headache.  NECK:  Supple.  No JVD or adenopathy.  HEENT:  Oropharynx is moist.  No trauma.  LUNGS:  Clear to auscultation anteriorly and posteriorly.  HEART:  Regular rate and rhythm without murmurs, gallops, rubs.  ABDOMEN:  Soft and nontender.  EXTREMITIES:  Without cyanosis, clubbing or edema.  No edema present.  NEUROLOGICAL:  Speech is  intact.  Cranial nerves are intact.  Extraocular  muscles intact.  Pupils equal, round and reactive to light.  I am unable to  perform an adequate funduscopic exam.  Nonfocal.  Gait is intact.  Sensation  is grossly intact.   ASSESSMENT/PLAN:  1. Seizures.  The patient has been loaded with Dilantin in the emergency     room.  She currently is displaying no focal findings.  She has had a CT     scan.  MR will be ordered as there is some question of some vague     abnormality in the temporal area which may be motion artifact, but     further evaluation is necessary.  I do not think she needs an LP at this     point.  Will see what the MR shows.  Will have neurology see the patient     to see if we can assist.  2. 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  It sounds like     musculoskeletal headache.  Again, this will be further evaluated as     indicated.  There is no evidence of neuropathy, etc, at this point.  Plan     was discussed with the patient and her husband who seemed to have     reasonable understanding.     ___________________________________________                                         Hanley Hays. Josefine Class, M.D.   FED/MEDQ  D:  06/13/2003  T:  06/14/2003  Job:  [id number]

## 2010-07-27 NOTE — Procedures (Signed)
[facility]  Patient:    [name], [name]                     MRN: [id number] Proc. Date: 12/10/99 Adm. Date:  [id number] Attending:  Deneen Harts CC:         Georgann Housekeeper, M.D.  Tomi Bamberger, P.A.-C., [facility]   Procedure Report  PROCEDURE:  Panendoscopy with biopsy.  INDICATIONS:  A 35 year old white female referred through the courtesy of Dr. Georgann Housekeeper, with complaints of abdominal pain, nausea.  The patient had been treated with Aciphex empirically for the past year.  One month ago, switched to Protonix 40 mg daily.  Denies history of gastric irritants. Undergoing endoscopy to further evaluate etiology of abdominal pain.  DESCRIPTION OF PROCEDURE:  After reviewing the nature of the procedure with the patient including potential risks and complications, and after discussing alternative methods of diagnosis and treatment, informed consent was signed.  The patient was premedicated, receiving IV sedation totalling Versed 7 mg, fentanyl 75 mcg administered in divided doses prior to and during the course of the procedure.  Using an Olympus video endoscope, proximal esophagus intubated under direct vision.  Normal oropharynx.  No lesion of the epiglottis, vocal cords, or piriform sinus.  The proximal, mid, and distal segments of the esophagus were normal, including a distinct mucosal Z-line at 38 cm.  No significant hiatal hernia.  No evidence of reflux disease.  The gastric fundus, body were normal.  The antrum was remarkable for erosive inflammation.  This appeared to be healing with minimal edema and erythema. Mild raised mucosa, coffee-ground, and exudate superficially.  Pylorus symmetric, easily traversed.  Duodenal bulb was mildly inflamed with edema and erythema.  Second portion of the duodenum was normal.  Retroflexed view of the angularis, lesser curve, gastric cardia, and fundus revealed no additional  finding.  Biopsies were taken from the angularis and prepyloric antrum for Helicobacter.  Stomach was then decompressed and scope withdrawn.  The patient tolerated the procedure without difficulty being maintained on Datascope monitor and low-flow oxygen throughout.  ASSESSMENT: 1. Erosive antritis - healing. 2. Duodenitis - mild. 3. Helicobacter biopsy obtained.  RECOMMENDATION: 1. Increase Protonix 40 mg b.i.d. for one month. 2. Treat if Helicobacter positive. 3. Avoid gastric irritants. 4. ROV in one month - decide on the need for ongoing acid suppression and    further evaluation pending response to treatment. DD:  12/10/99 TD:  12/10/99 Job: [id number]

## 2010-07-27 NOTE — Op Note (Signed)
NAME:  [name], [name]                        ACCOUNT NO.:  [id number]   MEDICAL RECORD NO.:  [id number]                   PATIENT TYPE:  OBV   LOCATION:  9399                                 FACILITY:  [facility]   PHYSICIAN:  Janine Limbo, M.D.            DATE OF BIRTH:  [birth year]   DATE OF PROCEDURE:  12/02/2001  DATE OF DISCHARGE:                                 OPERATIVE REPORT   PREOPERATIVE DIAGNOSES:  1. Pelvic pain.  2. Endometriosis.   POSTOPERATIVE DIAGNOSES:  1. Pelvic pain.  2. Endometriosis.  3. Pelvic adhesions.   PROCEDURES:  1. Laparoscopic-assisted vaginal hysterectomy.  2. Laparoscopic lysis of adhesions.  3. Laparoscopic pelvic biopsies.  4. Laparoscopic ablation of endometriosis.   SURGEON:  Janine Limbo, M.D.   FIRST ASSISTANT:  Elmira J. Adline Peals.   ANESTHESIA:  General.   DISPOSITION:  The patient is a 35 year old female, para 2-0-1-2, who  presents with the above-mentioned diagnosis.  The patient understands the  indications for her surgical procedure and she accepts the risks of, but not  limited to, anesthetic complications, bleeding, infections, and possible  damage to the surrounding organs.  She understands that no guarantees can be  given concerning the total relief of her pelvic discomfort.   FINDINGS:  The uterus was upper limits of normal size.  There were multiple  implants that were hyperpigmented and slightly raised.  All of these  implants were consistent with endometriosis.  The implants were present in  the anterior cul-de-sac, the posterior cul-de-sac, along the left pelvic  side wall, along the right pelvic side wall, and along the right peritoneal  surface of the abdominal cavity at the level of the mid abdomen.  The liver  and gallbladder appeared normal.  The fallopian tubes were normal except for  defects from her prior tubal ligation.  There was a 0.5-cm implant of  endometriosis on the right ovary.  The left  ovary appeared normal.  There  were moderate adhesions between the left pelvic side wall, the left adnexa,  and the left colon.  Once the adhesions were lysed, there were implants of  endometriosis on the colon.   PROCEDURE:  The patient was taken to the operating room where a general  anesthetic was given.  The patient's abdomen, perineum, and vagina were  prepped with multiple layers of Betadine.  Examination under anesthesia was  performed.  A Hulka tenaculum was placed inside the uterus.  A Foley  catheter was placed inside the bladder.  The patient was sterilely draped.  The subumbilical area was injected with 4 cc of 0.5% Marcaine.  An incision  was made and the Veress needle was inserted into the abdominal cavity.  Proper placement was confirmed using the saline drop test.  A  pneumoperitoneum was the obtained.  The laparoscopic trocar and then  laparoscope was substituted for the Veress needle.  The pelvic structures  were visualized with findings as mentioned above.  Two suprapubic areas were  injected with 3 cc of 0.5% Marcaine each.  Two incisions were made and two 5-  mm trocar were placed in the lower abdominal cavity under direct  visualization.  Again, the pelvic structures were carefully inspected.  Pictures were taken of the patient's pelvic and abdominal anatomy.  The  implants of endometriosis were then injected with normal saline.  Several of  the implants were resected using the laparoscopic scissors.  The other  implants were cauterized using the bipolar cautery.  Care was taken not to  damage any of the vital underlying structures.  The adhesions between the  left adnexa and the colon were then lysed.  Endometriosis was identified, as  mentioned above.  The adhesions were removed and some of the implants of  endometriosis were removed with the adhesions.  Again, care was taken not to  damage the bowel.  There were two areas, however, where the endometriosis  seemed  to overly the bowel itself and the decision was made not to try to  surgically resect or to try to surgically ablate these lesions.  The  endometriotic lesions on the right pelvic side wall and on the right  anterior abdominal side wall were then injected with saline and ablated  using the bipolar cautery.  Again, care was taken not to damage any of the  underlying structures.  The pelvis was irrigated and hemostasis was noted to  be adequate.  The decision was made at this point to proceed with the  vaginal hysterectomy.  The patient was placed in a more lithotomy position.  The cervix was injected with 30 cc of a diluted solution of Pitressin and  saline.  A circumferential incision was made around the cervix and the  vaginal mucosa was advanced both anteriorly and posteriorly.  The anterior  cul-de-sac and then the posterior cul-de-sac were sharply entered.  Alternating from right to left, the uterosacral ligaments, paracervical  tissues, parametrial tissues, and the uterine arteries were clamped, cut,  sutured, and tied securely.  The uterus was inverted through the posterior  colpotomy.  The upper pedicles were then secured using Haney clamps.  The  uterus was transected from our operative field.  The upper pedicles were  then free-tied and suture ligature.  Hemostasis was noted to adequate  throughout.  The sutures attached to the uterosacral ligaments were brought  out through the vaginal angle and tied securely.  Two McCall culdoplasty  sutures were placed in the posterior cul-de-sac incorporating the  uterosacral ligaments bilaterally and the peritoneum of the posterior cul-de-  sac.  A final check was made for hemostasis and again, hemostasis was  adequate.  The vaginal cuff was closed using figure-of-eight sutures  incorporating the anterior vaginal mucosa, the anterior peritoneum, the posterior peritoneum, and the posterior vaginal mucosa.  The McCall  culdoplasty sutures were  tied securely and the apex of the vagina was noted  to elevate into the mid pelvis.  The suture material used was 0 Vicryl  except where otherwise mentioned.  Sponge, instrument, and needle counts  were correct on two occasions.  The estimated blood loss was 100 cc.  The  estimated urine output was 150 cc.  The patient was noted to drain clear  yellow urine.  The operator then changed his gloves and gown.  A  pneumoperitoneum was again obtained.  The pelvis was carefully inspected.  The pelvis was irrigated and  hemostasis was noted to adequate throughout.  The bowel was carefully inspected and there was no evidence of trocar  damage.  All instruments were removed.  The incisions in the abdomen were  closed using deep and superficial sutures of 4-0 Vicryl.  Again, hemostasis  was adequate.  The patient was awakened from her anesthetic and taken to the  recovery room in stable condition.                                                  Janine Limbo, M.D.    AVS/MEDQ  D:  12/02/2001  T:  12/02/2001  Job:  [id number]

## 2010-07-27 NOTE — Consult Note (Signed)
NAME:  [name], [name]                        ACCOUNT NO.:  [id number]   MEDICAL RECORD NO.:  [id number]                   PATIENT TYPE:  INP   LOCATION:  A228                                 FACILITY:  [facility]   PHYSICIAN:  Kofi A. Gerilyn Pilgrim, M.D.              DATE OF BIRTH:  [birth year]   DATE OF CONSULTATION:  DATE OF DISCHARGE:                                   CONSULTATION   IMPRESSION:  1. 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  The workup does not     indicate any serious underlying secondary causes.  2. Chronic daily headaches/intractable migraine headaches/transformed     migraine headaches.  3. Some undesirable side effects of Dilantin.   RECOMMENDATIONS:  Switch Dilantin to Keppra and titrate this gradually to at  least 1000 mg b.i.d.  I believe this can serve to help with headache  prophylaxis and also with seizure prevention.   HISTORY:  A 35 year old Caucasian female who has a longstanding history of  episodic headaches.  Recently, her headaches have been more constant.  She  is followed by Dr. Fredric Mare in [place].  She apparently has had headaches  since the age of 35 years old.  In fact, she was placed on Depakote  prophylaxis and had significant alopecia from this medication.  This  medication was subsequently discontinued.  The patient reports having the  headaches on a daily basis; however, the headaches get severe sometimes.  She reports having three other convulsions before this prior event.  They  are relatively stereotyped.  They occur after a week long of severe  headaches.  The headaches are typically on the left side.  The headache  seems to start in the posterior neck region and radiates anteriorly.  She is  having significant nausea and vomiting with her headaches and with the most  recent one.  After about a week with the headaches improving, she sometimes  has the seizure/convulsion activity.  The semeiology reportedly consists of  the sudden  onset of visual disturbance described as patient looking into a  kaleidoscope.  This is then followed by her head then turning towards the  left and then convulsions.  The aura with the kaleidoscope and the actual  convulsion apparently lasts a couple of minutes.  The convulsions also last  a couple of minutes.  She is typically disoriented, fatigued, and sleepy  afterwards.  She does not report having any urinary or bladder incontinence.  She has had oral trauma once but not with this most recent spell.  She  apparently has had four EEGs, which have been normal.  She was told that  these spells are likely not epileptic, as she has had four unremarkable  EEGs.  The patient was tried on Topamax prophylactic medication.  This may  on occasion cause significant nausea, and she was just not able to tolerate  this.  She was most recently placed on  atenolol.  So far, she has not noted  any significant improvement.  She also is taking Relpax for migraine  treatment.  Other medications include Motrin and other over-the-counter  medications.   PAST MEDICAL HISTORY:  As stated in the history of present illness.   ADMISSION MEDICATIONS:  Atenolol, Relpax, Flexeril, Ultram.   ALLERGIES:  1. MORPHINE.  2. METHADONE.  3. LATEX GLOVES.   SOCIAL HISTORY:  She is married.  She does not smoke.  She does not use  alcohol or illicit drugs.   FAMILY HISTORY:  Unremarkable.   REVIEW OF SYSTEMS:  As stated in the history of present illness.  Patient  reports feeling drowsy after taking the IV Dilantin every 8 hours.  She also  complains of, per the nurse, itching after Dilantin, and also vaginal  burning.  The itching apparently involves her entire body.   PHYSICAL EXAMINATION:  VITAL SIGNS:  She has been afebrile since being in  the hospital.  Current temperature 98.0, pulse 85, respirations 20, blood  pressure 123/72.  GENERAL:  A pleasant lady in no acute distress at this time.  She does seem  to  have some discomfort from the headache.  The lights are out.  The room is  dark.  The TV is turned down.  NECK:  Supple.  Patient in the head and neck region does not reveal any  bruits.  LUNGS:  Clear to auscultation bilaterally.  HEART:  Normal S1 and S2.  ABDOMEN:  Soft.  EXTREMITIES:  No significant edema.  No varicosities.  NEUROLOGIC:  She is awake and alert.  She converses fluently currently.  There is no dysarthria or aphasia noted.  Cranial nerves are 2/5 mm and  briskly reactive.  Extraocular movements are intact.  Visual fields are  intact.  Funduscopic examination shows evidence of blurring of the disks,  but this seems consistent with a druzen.  She is noted to have spontaneous  venous pulsations.  Blood vessels are not clouded.  Motor examination shows  normal tone, bulk, and strength.  There is no pronator drift.  Coordination  is intact.  Reflexes are +2.  Plantar reflexes are upgoing on the right and  downgoing on the left.  Sensory examination is normal to temperature and  light touch.   CT scan of the brain was read as essentially unremarkable.  There is some  question as to possible edema in the temporal lobe on both sides, right  greater than left; however, MRI of the brain was done, and this finding was  not substantiated.  The MRI of the brain with and without contrast is  essentially unremarkable.   LABORATORY VALUES:  Potassium 2.9 on admission.  Sodium 141, magnesium 2.1,  calcium 9.0.  Liver enzymes normal.  Glucose 107, BUN 17, creatinine 1.1,  CO2 15, chloride 103.  WBC 19, hemoglobin 14, platelet count 355.  Urinalysis negative.  Urine drug screen positive for benzodiazepine,  otherwise unremarkable.   Thank you for this consultation.      ___________________________________________                                            Perlie Gold Gerilyn Pilgrim, M.D.   KAD/MEDQ  D:  05/03/2003  T:  05/03/2003  Job:  [id number]

## 2010-07-27 NOTE — Procedures (Signed)
[name], [name]              ACCOUNT NO.:  [id number]   MEDICAL RECORD NO.:  [id number]          PATIENT TYPE:  OUT   LOCATION:  RAD                           FACILITY:  [facility]   PHYSICIAN:  Charlton Haws, M.D.     DATE OF BIRTH:  [birth year]   DATE OF PROCEDURE:  DATE OF DISCHARGE:                                  ECHOCARDIOGRAM   INDICATIONS:  Palpitations.   Left ventricular cavity size was normal. There was no LVH. Ejection fraction  was 65%. There were no regional wall motion abnormalities. Mitral, aortic,  tricuspid valves were structurally normal. There was trace TR. Pulmonary  valve was not well visualized. Aortic valve was trileaflet. Aortic root was  normal. Subcostal imaging revealed no atrial septal defect, no source of  embolus, and no effusion. Aortic dimension was 2.5 cm. Left atrial dimension  was 3.7 cm. Septum was 0.8 mm. Posterior wall was 0.8 cm.   IMPRESSION:  Essentially normal 2-D echocardiogram. Ejection fraction 65%.  No evidence of mitral valve prolapse.           ______________________________  Charlton Haws, M.D.     PN/MEDQ  D:  12/21/2004  T:  12/21/2004  Job:  [id number]

## 2010-07-27 NOTE — H&P (Signed)
NAME:  [name], [name]                        ACCOUNT NO.:   MEDICAL RECORD NO.:  [id number]                   PATIENT TYPE:  AMB   LOCATION:  SDC                                  FACILITY:  [facility]   PHYSICIAN:  Janine Limbo, M.D.            DATE OF BIRTH:  [birth year]   DATE OF ADMISSION:  12/02/2001  DATE OF DISCHARGE:                                HISTORY & PHYSICAL   HISTORY OF PRESENT ILLNESS:  The patient is a 35 year old female, para 2-0-1-  2, who presents for laparoscopy-assisted vaginal hysterectomy because of  endometriosis.  The patient has a long history of chronic pelvic pain.  She  had a diagnostic laparoscopy performed in 1997, at which time she was found  to have endometriosis.  She also had a laparoscopic tubal cautery at the  time.  The patient had an appendectomy performed at age 74.  Her most recent  Pap smear was within normal limits.  She has a past history of chlamydia and  trichomoniasis.  Her most recent cultures for gonorrhea and chlamydia were  negative.   OBSTETRICAL HISTORY:  The patient has had two vaginal deliveries and one  miscarriage.   PAST MEDICAL HISTORY:  The patient had an appendectomy at age 60, as  mentioned above.  She denies hypertension, diabetes, and other major medical  illnesses.   DRUG ALLERGIES:  The patient reports that she is allergic to [facility].  She  also reports that she has a LASIX allergy.   SOCIAL HISTORY:  The patient denies cigarette use, alcohol use and  recreational drug use.   REVIEW OF SYSTEMS:  The patient complains of constipation.  She also has  dyspareunia.   FAMILY HISTORY:  The patient's paternal grandmother has breast cancer.   PHYSICAL EXAMINATION:  VITAL SIGNS:  Weight 137 pounds.  HEENT:  Within normal limits.  CHEST:  Clear.  HEART:  Regular rate and rhythm.  BREASTS:  Without masses.  ABDOMEN:  Nontender and no masses are appreciated.  EXTREMITIES:  Within normal limits.  NEUROLOGIC:  Grossly  normal.  PELVIC:  External genitalia is normal.  Vagina is normal, except for weak  pelvic musculature.  The cervix is nontender.  The uterus is upper limits of  normal size and is tender to palpation.  Adnexal no masses appreciated.  Rectovaginal exam confirmed.   ASSESSMENT:  1. Pelvic pain.  2. Endometriosis.   PLAN:  The patient will undergo a laparoscopy-assisted vaginal hysterectomy.  We have discussed the medical and surgical options, which include birth  control pills, Depo-Provera, and Depo-Lupron.  The patient has been treated  with hormone therapy in the past, and this was unsuccessful in controlling  her discomfort.  She does not want to  accept the risks associated with Depo-Lupron.  She understands the  indications for her procedure, and she accepts the risks of, but not limited  to, anesthetic complications, bleeding, infections and  possible damage to  the surrounding organs.  She understands that no guarantees can be given  concerning the total relief of her pelvic discomfort.                                               Janine Limbo, M.D.    AVS/MEDQ  D:  12/01/2001  T:  12/02/2001  Job:  [id number]

## 2010-07-27 NOTE — Discharge Summary (Signed)
NAME:  [name], [name]                        ACCOUNT NO.:  [id number]   MEDICAL RECORD NO.:  [id number]                   PATIENT TYPE:  INP   LOCATION:  A228                                 FACILITY:  [facility]   PHYSICIAN:  Hanley Hays. Dechurch, M.D.           DATE OF BIRTH:  [birth year]   DATE OF ADMISSION:  05/02/2003  DATE OF DISCHARGE:  05/05/2003                                 DISCHARGE SUMMARY   DIAGNOSES:  1. Seizure related to migraine with no other serious underlying cause noted     per neurology.  2. Chronic daily headache/ intractable migraine headache and ______migraine     headaches.  3. 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  4. Hypokalemia, resolved.   DISPOSITION:  The patient is discharged to home.   FOLLOWUP:  Dr. Gerilyn Pilgrim.  The patient was instructed to call to schedule  with Dr. Gerilyn Pilgrim on March 17.   MEDICATIONS:  Keppra 500 mg q.h.s., increase by one tablet weekly as  directed, per Dr. Gerilyn Pilgrim.   CONDITION:  Improved.   HOSPITAL COURSE:  A 35 year old Caucasian female with a history of chronic  headaches, who was somewhat of a difficulty historian, but due to the fact  she had multiple headache type complaints, but after careful history taking,  it appeared that she had musculoskeletal headaches, but also had classic  migraine headaches.  Over the course of the last year, she has had seizure  events associated with these headaches.  She has had multiple ER evaluations  as well as outpatient workups including four normal EEG's.  She had been  tried on different prophylactic medications to prevent the headaches, but  did not tolerate them for various reasons.  She presented to the emergency  room again after experiencing a seizure.  Neural consultation was obtained.  An MR of the brain was obtained as well because CT questioned some  abnormality of the temporal lobes, especially on the right.  However, an MRI  revealed an absolutely normal study.  The patient was placed on Keppra.  She  also was placed on Toradol and Reglan regimen.  Over the course of the next  24 hours, her headache resolved.  She was feeling much better, and she was discharged to home with the  followup as noted above.     ___________________________________________                                         Hanley Hays. Josefine Class, M.D.   FED/MEDQ  D:  05/24/2003  T:  05/25/2003  Job:  [id number]   cc:   [facility]

## 2010-07-27 NOTE — Procedures (Signed)
   NAME:  [name], [name]                          ACCOUNT NO.:  [id number]   MEDICAL RECORD NO.:  [id number]                    PATIENT TYPE:   LOCATION:                                       FACILITY:   PHYSICIAN:  Edward L. Juanetta Gosling, M.D.             DATE OF BIRTH:   DATE OF PROCEDURE:  DATE OF DISCHARGE:                                EKG INTERPRETATION   TIME AND DATE:  1749 on 06/23/2002   INTERPRETATION:  The rhythm is sinus tachycardia with a rate of about 126.  There are diffuse, nonspecific T wave abnormalities.   IMPRESSION:  Abnormal electrocardiogram.                                               Edward L. Juanetta Gosling, M.D.    ELH/MEDQ  D:  06/30/2002  T:  07/01/2002  Job:  [id number]

## 2010-12-07 LAB — URINALYSIS, ROUTINE W REFLEX MICROSCOPIC
Bilirubin Urine: NEGATIVE
Bilirubin Urine: NEGATIVE
Bilirubin Urine: NEGATIVE
Glucose, UA: 100 — AB
Glucose, UA: NEGATIVE
Glucose, UA: NEGATIVE
Hgb urine dipstick: NEGATIVE
Hgb urine dipstick: NEGATIVE
Hgb urine dipstick: NEGATIVE
Ketones, ur: NEGATIVE
Ketones, ur: NEGATIVE
Ketones, ur: NEGATIVE
Nitrite: NEGATIVE
Nitrite: NEGATIVE
Nitrite: NEGATIVE
Protein, ur: NEGATIVE
Protein, ur: NEGATIVE
Protein, ur: NEGATIVE
Specific Gravity, Urine: 1.005
Specific Gravity, Urine: 1.01
Specific Gravity, Urine: 1.02
Urobilinogen, UA: 0.2
Urobilinogen, UA: 0.2
Urobilinogen, UA: 0.2
pH: 6
pH: 7
pH: 8

## 2010-12-07 LAB — CBC
HCT: 32.4 — ABNORMAL LOW
HCT: 34.2 — ABNORMAL LOW
HCT: 34.7 — ABNORMAL LOW
HCT: 38.4
HCT: 39.3
Hemoglobin: 11 — ABNORMAL LOW
Hemoglobin: 11.4 — ABNORMAL LOW
Hemoglobin: 11.9 — ABNORMAL LOW
Hemoglobin: 13.3
Hemoglobin: 13.5
MCHC: 33.2
MCHC: 34
MCHC: 34.2
MCHC: 34.4
MCHC: 34.6
MCV: 89.7
MCV: 90
MCV: 90.9
MCV: 91.4
MCV: 92.4
Platelets: 198
Platelets: 200
Platelets: 206
Platelets: 235
Platelets: 241
RBC: 3.55 — ABNORMAL LOW
RBC: 3.7 — ABNORMAL LOW
RBC: 3.82 — ABNORMAL LOW
RBC: 4.27
RBC: 4.38
RDW: 11.8
RDW: 11.9
RDW: 12
RDW: 12.1
RDW: 12.1
WBC: 10.3
WBC: 10.4
WBC: 7
WBC: 7.2
WBC: 7.3

## 2010-12-07 LAB — DIFFERENTIAL
Basophils Absolute: 0
Basophils Absolute: 0.1
Basophils Absolute: 0.1
Basophils Absolute: 0.1
Basophils Absolute: 0.1
Basophils Relative: 0
Basophils Relative: 1
Basophils Relative: 1
Basophils Relative: 1
Basophils Relative: 1
Eosinophils Absolute: 0
Eosinophils Absolute: 0
Eosinophils Absolute: 0.1
Eosinophils Absolute: 0.1
Eosinophils Absolute: 0.1
Eosinophils Relative: 0
Eosinophils Relative: 1
Eosinophils Relative: 1
Eosinophils Relative: 2
Eosinophils Relative: 2
Lymphocytes Relative: 12
Lymphocytes Relative: 14
Lymphocytes Relative: 18
Lymphocytes Relative: 36
Lymphocytes Relative: 39
Lymphs Abs: 1.2
Lymphs Abs: 1.3
Lymphs Abs: 1.4
Lymphs Abs: 2.6
Lymphs Abs: 2.7
Monocytes Absolute: 0.4
Monocytes Absolute: 0.5
Monocytes Absolute: 0.5
Monocytes Absolute: 0.5
Monocytes Absolute: 0.7
Monocytes Relative: 4
Monocytes Relative: 6
Monocytes Relative: 6
Monocytes Relative: 7
Monocytes Relative: 7
Neutro Abs: 3.5
Neutro Abs: 4
Neutro Abs: 5.3
Neutro Abs: 8.1 — ABNORMAL HIGH
Neutro Abs: 8.6 — ABNORMAL HIGH
Neutrophils Relative %: 50
Neutrophils Relative %: 55
Neutrophils Relative %: 74
Neutrophils Relative %: 78 — ABNORMAL HIGH
Neutrophils Relative %: 83 — ABNORMAL HIGH

## 2010-12-07 LAB — BASIC METABOLIC PANEL
BUN: 1 — ABNORMAL LOW
BUN: 4 — ABNORMAL LOW
BUN: 6
BUN: 9
CO2: 24
CO2: 26
CO2: 26
CO2: 26
Calcium: 8.3 — ABNORMAL LOW
Calcium: 8.4
Calcium: 8.5
Calcium: 9.2
Chloride: 109
Chloride: 111
Chloride: 111
Chloride: 113 — ABNORMAL HIGH
Creatinine, Ser: 0.62
Creatinine, Ser: 0.66
Creatinine, Ser: 0.67
Creatinine, Ser: 0.72
GFR calc Af Amer: >60
GFR calc Af Amer: >60
GFR calc Af Amer: >60
GFR calc Af Amer: >60
GFR calc non Af Amer: >60
GFR calc non Af Amer: >60
GFR calc non Af Amer: >60
GFR calc non Af Amer: >60
Glucose, Bld: 106 — ABNORMAL HIGH
Glucose, Bld: 85
Glucose, Bld: 94
Glucose, Bld: 96
Potassium: 3.4 — ABNORMAL LOW
Potassium: 3.7
Potassium: 3.9
Potassium: 4.1
Sodium: 140
Sodium: 140
Sodium: 141
Sodium: 141

## 2010-12-07 LAB — HEPATIC FUNCTION PANEL
ALT: 13
ALT: 31
AST: 15
AST: 22
Albumin: 3.2 — ABNORMAL LOW
Albumin: 4.1
Alkaline Phosphatase: 38 — ABNORMAL LOW
Alkaline Phosphatase: 44
Bilirubin, Direct: 0.1
Bilirubin, Direct: 0.1
Indirect Bilirubin: 0.4
Indirect Bilirubin: 0.4
Total Bilirubin: 0.5
Total Bilirubin: 0.5
Total Protein: 5.4 — ABNORMAL LOW
Total Protein: 7

## 2010-12-07 LAB — COMPREHENSIVE METABOLIC PANEL
ALT: 13
AST: 17
Albumin: 4.2
Alkaline Phosphatase: 41
BUN: 8
CO2: 25
Calcium: 9.2
Chloride: 107
Creatinine, Ser: 0.75
GFR calc Af Amer: >60
GFR calc non Af Amer: >60
Glucose, Bld: 113 — ABNORMAL HIGH
Potassium: 3.7
Sodium: 137
Total Bilirubin: 0.6
Total Protein: 6.8

## 2010-12-07 LAB — PREGNANCY, URINE: Preg Test, Ur: NEGATIVE

## 2010-12-07 LAB — LIPASE, BLOOD: Lipase: 19

## 2010-12-14 LAB — CBC
HCT: 35 — ABNORMAL LOW
Hemoglobin: 12.1
MCHC: 34.6
MCV: 89.1
Platelets: 219
RBC: 3.93
RDW: 12.3
WBC: 12.3 — ABNORMAL HIGH

## 2010-12-14 LAB — DIFFERENTIAL
Basophils Absolute: 0.1
Basophils Relative: 0
Eosinophils Absolute: 0 — ABNORMAL LOW
Eosinophils Relative: 0
Lymphocytes Relative: 9 — ABNORMAL LOW
Lymphs Abs: 1.1
Monocytes Absolute: 0.4
Monocytes Relative: 3
Neutro Abs: 10.8 — ABNORMAL HIGH
Neutrophils Relative %: 88 — ABNORMAL HIGH

## 2010-12-14 LAB — URINALYSIS, ROUTINE W REFLEX MICROSCOPIC
Bilirubin Urine: NEGATIVE
Glucose, UA: NEGATIVE
Ketones, ur: 15 — AB
Leukocytes, UA: NEGATIVE
Nitrite: NEGATIVE
Protein, ur: NEGATIVE
Specific Gravity, Urine: 1.025
Urobilinogen, UA: 0.2
pH: 5.5

## 2010-12-14 LAB — URINE MICROSCOPIC-ADD ON

## 2010-12-14 LAB — BASIC METABOLIC PANEL
BUN: 12
CO2: 23
Calcium: 8.5
Chloride: 108
Creatinine, Ser: 0.54
GFR calc Af Amer: >60
GFR calc non Af Amer: >60
Glucose, Bld: 113 — ABNORMAL HIGH
Potassium: 3.6
Sodium: 136

## 2010-12-17 LAB — URINALYSIS, ROUTINE W REFLEX MICROSCOPIC
Bilirubin Urine: NEGATIVE
Glucose, UA: NEGATIVE
Ketones, ur: NEGATIVE
Leukocytes, UA: NEGATIVE
Nitrite: NEGATIVE
Protein, ur: NEGATIVE
Specific Gravity, Urine: 1.015
Urobilinogen, UA: 0.2
pH: 5.5

## 2010-12-17 LAB — CBC
HCT: 34.3 — ABNORMAL LOW
Hemoglobin: 11.8 — ABNORMAL LOW
MCHC: 34.4
MCV: 89.2
Platelets: 227
RBC: 3.85 — ABNORMAL LOW
RDW: 12.4
WBC: 10.1

## 2010-12-17 LAB — BASIC METABOLIC PANEL
BUN: 10
CO2: 23
Calcium: 8.6
Chloride: 109
Creatinine, Ser: 0.63
GFR calc Af Amer: >60
GFR calc non Af Amer: >60
Glucose, Bld: 107 — ABNORMAL HIGH
Potassium: 4.6
Sodium: 136

## 2010-12-17 LAB — URINE MICROSCOPIC-ADD ON

## 2010-12-17 LAB — DIFFERENTIAL
Basophils Absolute: 0.1
Basophils Relative: 1
Eosinophils Absolute: 0 — ABNORMAL LOW
Eosinophils Relative: 0
Lymphocytes Relative: 7 — ABNORMAL LOW
Lymphs Abs: 0.7
Monocytes Absolute: 0.2
Monocytes Relative: 2 — ABNORMAL LOW
Neutro Abs: 9.1 — ABNORMAL HIGH
Neutrophils Relative %: 90 — ABNORMAL HIGH

## 2010-12-17 LAB — INFLUENZA A+B VIRUS AG-DIRECT(RAPID)
Inflenza A Ag: NEGATIVE
Influenza B Ag: NEGATIVE

## 2010-12-22 ENCOUNTER — Encounter: Payer: Self-pay | Admitting: *Deleted

## 2010-12-22 ENCOUNTER — Emergency Department (HOSPITAL_COMMUNITY)
Admission: EM | Admit: 2010-12-22 | Discharge: 2010-12-22 | Disposition: A | Discharge status: Home / Self Care | Payer: BC Managed Care – PPO | Attending: Emergency Medicine | Admitting: Emergency Medicine

## 2010-12-22 ENCOUNTER — Emergency Department (HOSPITAL_COMMUNITY): Payer: BC Managed Care – PPO

## 2010-12-22 DIAGNOSIS — R569 Unspecified convulsions: Secondary | ICD-10-CM | POA: Insufficient documentation

## 2010-12-22 DIAGNOSIS — IMO0001 Reserved for inherently not codable concepts without codable children: Secondary | ICD-10-CM | POA: Insufficient documentation

## 2010-12-22 DIAGNOSIS — R51 Headache: Secondary | ICD-10-CM | POA: Insufficient documentation

## 2010-12-22 DIAGNOSIS — R209 Unspecified disturbances of skin sensation: Secondary | ICD-10-CM | POA: Insufficient documentation

## 2010-12-22 DIAGNOSIS — R413 Other amnesia: Secondary | ICD-10-CM | POA: Insufficient documentation

## 2010-12-22 DIAGNOSIS — M129 Arthropathy, unspecified: Secondary | ICD-10-CM | POA: Insufficient documentation

## 2010-12-22 HISTORY — DX: Fibromyalgia: M79.7

## 2010-12-22 HISTORY — DX: Unspecified osteoarthritis, unspecified site: M19.90

## 2010-12-22 LAB — BASIC METABOLIC PANEL
BUN: 12 mg/dL (ref 6–23)
CO2: 27 mEq/L (ref 19–32)
Calcium: 8.9 mg/dL (ref 8.4–10.5)
Chloride: 102 mEq/L (ref 96–112)
Creatinine, Ser: 0.54 mg/dL (ref 0.50–1.10)
GFR calc Af Amer: >90 mL/min (ref >90)
GFR calc non Af Amer: >90 mL/min (ref >90)
Glucose, Bld: 96 mg/dL (ref 70–99)
Potassium: 4.2 mEq/L (ref 3.5–5.1)
Sodium: 139 mEq/L (ref 135–145)

## 2010-12-22 LAB — CBC
HCT: 39.8 % (ref 36.0–46.0)
Hemoglobin: 13.6 g/dL (ref 12.0–15.0)
MCH: 30.4 pg (ref 26.0–34.0)
MCHC: 34.2 g/dL (ref 30.0–36.0)
MCV: 88.8 fL (ref 78.0–100.0)
Platelets: 272 10*3/uL (ref 150–400)
RBC: 4.48 MIL/uL (ref 3.87–5.11)
RDW: 12.1 % (ref 11.5–15.5)
WBC: 10.5 10*3/uL (ref 4.0–10.5)

## 2010-12-22 MED ORDER — ASPIRIN 81 MG PO CHEW: 81.0000 mg | CHEWABLE_TABLET | Freq: Every day | ORAL | Status: AC

## 2010-12-22 MED ORDER — ASPIRIN 81 MG PO CHEW
81.0000 mg | CHEWABLE_TABLET | Freq: Once | ORAL | Status: AC
  Administered 2010-12-22: 81 mg via ORAL
  Filled 2010-12-22: qty 1

## 2010-12-22 NOTE — ED Notes (Signed)
Pt had a sharp pain in her head on Wednesday. Pt c/o memory loss ever since then. Pt states it feels like she went to sleep, woke up and doesn't remember anything.

## 2010-12-22 NOTE — ED Provider Notes (Signed)
History     CSN: [id number] Arrival date & time: 12/22/2010  2:02 AM  Chief Complaint  Patient presents with  . Memory Loss    (Consider location/radiation/quality/duration/timing/severity/associated sxs/prior treatment) HPI Mr. Shon Baton onset of severe headache on the left side approximately 5 days ago.  Since then she has had intermittent episodes of forgetfulness.  An example was given that the last time she remembers gas prices they were $2 which was likely several years ago.  She reports no headache at this time.  No unilateral weakness.  No facial droop.  She does report mild paresthesias in the left side of her face several days ago that has since resolved.  She has no prior history of headaches.  No recent trauma to her head.  No nausea vomiting or changes in her vision.  No fevers or chills.  Nothing worsens her symptoms.  Nothing improves her symptoms.  She has been able to continue doing normal daily activities such as picking up her child from school driving her car and doing regular work around the house.  Her symptoms are mild.  There is a strong family history of early stroke with a uncle who had a stroke at age 42 and then died of a "massive" stroke   Past Medical History  Diagnosis Date  . Seizures   . Arthritis   . Fibromyalgia     Past Surgical History  Procedure Date  . Vaginal hysterectomy   . Appendectomy   . Cholecystectomy     History reviewed. No pertinent family history.  History  Substance Use Topics  . Smoking status: Never Smoker   . Smokeless tobacco: Not on file  . Alcohol Use: No    OB History    Grav Para Term Preterm Abortions TAB SAB Ect Mult Living                  Review of Systems  All other systems reviewed and are negative.    Allergies  Codeine; Hydrocodone; Latex; and Morphine and related  Home Medications   Current Outpatient Rx  Name Route Sig Dispense Refill  . VITAMIN C PO Oral Take by mouth.      Marland Kitchen LEVETIRACETAM  1000 MG PO TABS Oral Take 1,000 mg by mouth 2 (two) times daily.      Marland Kitchen VITAMIN A PO Oral Take by mouth.        BP 143/84  Pulse 95  Temp(Src) 97.7 F (36.5 C) (Oral)  Resp 20  Wt 130 lb (58.968 kg)  SpO2 100%  Physical Exam  Nursing note and vitals reviewed. Constitutional: She appears well-developed and well-nourished.  HENT:  Head: Normocephalic and atraumatic.  Eyes: Pupils are equal, round, and reactive to light.  Neck: Normal range of motion.       No carotid bruits  Cardiovascular: Normal rate and regular rhythm.   Pulmonary/Chest: Effort normal.  Abdominal: Soft.  Neurological: She is alert.       5/5 strength in major muscle groups of  bilateral upper and lower extremities. Speech normal. No facial asymetry.  Oriented to person place year month but not day of the week  Skin: Skin is warm and dry.    ED Course  Procedures (including critical care time)   Labs Reviewed  CBC  BASIC METABOLIC PANEL   Ct Head Wo Contrast  12/22/2010  *RADIOLOGY REPORT*  Clinical Data: Memory loss.  Sharp pain in the head on Wednesday.  CT HEAD WITHOUT CONTRAST  Technique:  Contiguous axial images were obtained from the base of the skull through the vertex without contrast.  Comparison: 05/12/2008  Findings: Ventricles and sulci appear symmetrical.  No mass effect or midline shift.  No abnormal extra-axial fluid collections.  Wallace Cullens- white matter junctions are distinct.  Basal cisterns are not effaced.  No evidence of acute intracranial hemorrhage.  Visualized paranasal sinuses are not opacified.  No depressed skull fractures. No significant change since previous study.  IMPRESSION: No evidence of acute intracranial hemorrhage, mass lesion, or acute infarct.  Original Report Authenticated By: Marlon Pel, M.D.     1. Headache   2. Amnesia memory loss       MDM  Patient with amnesia and mild left-sided headache.  She's had some paresthesias of the left side of her face was weak  as well.  Consideration of small stroke versus intracranial mass.  CT considered however not a candidate given symptoms onset greater than 3 hours ago.  We'll obtain labs and head CT.  Patient will likely require an MRI to further evaluate.  There is a strong family history of stroke before the age of 16 in her family.         Lyanne Co, MD 12/22/10 [phone]

## 2010-12-22 NOTE — ED Notes (Signed)
Pt left the er stating no needs 

## 2010-12-24 ENCOUNTER — Other Ambulatory Visit (HOSPITAL_COMMUNITY): Payer: BC Managed Care – PPO

## 2010-12-28 ENCOUNTER — Ambulatory Visit (HOSPITAL_COMMUNITY): Admission: RE | Admit: 2010-12-28 | Payer: BC Managed Care – PPO | Source: Ambulatory Visit

## 2012-02-21 ENCOUNTER — Emergency Department (HOSPITAL_COMMUNITY): Payer: BC Managed Care – PPO

## 2012-02-21 ENCOUNTER — Encounter (HOSPITAL_COMMUNITY): Payer: Self-pay | Admitting: *Deleted

## 2012-02-21 ENCOUNTER — Emergency Department (HOSPITAL_COMMUNITY)
Admission: EM | Admit: 2012-02-21 | Discharge: 2012-02-21 | Disposition: A | Discharge status: Home / Self Care | Payer: BC Managed Care – PPO | Attending: Emergency Medicine | Admitting: Emergency Medicine

## 2012-02-21 DIAGNOSIS — M129 Arthropathy, unspecified: Secondary | ICD-10-CM | POA: Insufficient documentation

## 2012-02-21 DIAGNOSIS — IMO0001 Reserved for inherently not codable concepts without codable children: Secondary | ICD-10-CM | POA: Insufficient documentation

## 2012-02-21 DIAGNOSIS — R079 Chest pain, unspecified: Secondary | ICD-10-CM | POA: Insufficient documentation

## 2012-02-21 DIAGNOSIS — R509 Fever, unspecified: Secondary | ICD-10-CM | POA: Insufficient documentation

## 2012-02-21 DIAGNOSIS — Z79899 Other long term (current) drug therapy: Secondary | ICD-10-CM | POA: Insufficient documentation

## 2012-02-21 DIAGNOSIS — J40 Bronchitis, not specified as acute or chronic: Secondary | ICD-10-CM | POA: Insufficient documentation

## 2012-02-21 DIAGNOSIS — G40909 Epilepsy, unspecified, not intractable, without status epilepticus: Secondary | ICD-10-CM | POA: Insufficient documentation

## 2012-02-21 MED ORDER — HYDROCOD POLST-CHLORPHEN POLST 10-8 MG/5ML PO LQCR: 5.0000 mL | Freq: Two times a day (BID) | ORAL | Status: DC | PRN

## 2012-02-21 MED ORDER — HYDROCOD POLST-CHLORPHEN POLST 10-8 MG/5ML PO LQCR
5.0000 mL | Freq: Once | ORAL | Status: AC
  Administered 2012-02-21: 5 mL via ORAL
  Filled 2012-02-21: qty 5

## 2012-02-21 MED ORDER — ALBUTEROL SULFATE HFA 108 (90 BASE) MCG/ACT IN AERS
2.0000 | INHALATION_SPRAY | RESPIRATORY_TRACT | Status: DC
  Administered 2012-02-21: 2 via RESPIRATORY_TRACT
  Filled 2012-02-21: qty 6.7

## 2012-02-21 NOTE — ED Provider Notes (Signed)
History     CSN: [id number]  Arrival date & time 02/21/12  1045   First MD Initiated Contact with Patient 02/21/12 1101      Chief Complaint  Patient presents with  . Cough    (Consider location/radiation/quality/duration/timing/severity/associated sxs/prior treatment) HPI Comments: Subjective fever.  Pt of dr. Cyndia Bent.   Patient is a 36 y.o. female presenting with cough. The history is provided by the patient. No language interpreter was used.  Cough This is a new problem. The problem occurs constantly. The cough is productive of purulent sputum. Associated symptoms include chest pain and myalgias. Pertinent negatives include no chills. She has tried decongestants (guiafenisin) for the symptoms. She is not a smoker. Her past medical history is significant for bronchitis. Her past medical history does not include pneumonia.    Past Medical History  Diagnosis Date  . Seizures   . Arthritis   . Fibromyalgia     Past Surgical History  Procedure Date  . Vaginal hysterectomy   . Appendectomy   . Cholecystectomy     History reviewed. No pertinent family history.  History  Substance Use Topics  . Smoking status: Never Smoker   . Smokeless tobacco: Not on file  . Alcohol Use: No    OB History    Grav Para Term Preterm Abortions TAB SAB Ect Mult Living                  Review of Systems  Constitutional: Positive for fever. Negative for chills.  Respiratory: Positive for cough.   Cardiovascular: Positive for chest pain.  Musculoskeletal: Positive for myalgias.  All other systems reviewed and are negative.    Allergies  Codeine; Hydrocodone; Latex; and Morphine and related  Home Medications   Current Outpatient Rx  Name  Route  Sig  Dispense  Refill  . VITAMIN C PO   Oral   Take 1 tablet by mouth daily.          Marland Kitchen DM-GUAIFENESIN ER 30-600 MG PO TB12   Oral   Take 1 tablet by mouth every 12 (twelve) hours.         Marland Kitchen MAGNESIUM 250 MG PO TABS    Oral   Take 2 tablets by mouth 4 (four) times daily.         Marland Kitchen PSEUDOEPHEDRINE HCL 30 MG PO TABS   Oral   Take 30 mg by mouth every 4 (four) hours as needed. Cough/cold         . VITAMIN A PO   Oral   Take 1 tablet by mouth daily.          Marland Kitchen HYDROCOD POLST-CPM POLST ER 10-8 MG/5ML PO LQCR   Oral   Take 5 mLs by mouth every 12 (twelve) hours as needed (Cough).   115 mL   0   . LEVETIRACETAM 1000 MG PO TABS   Oral   Take 1,000 mg by mouth 2 (two) times daily.             BP 138/89  Pulse 109  Temp 99.4 F (37.4 C) (Oral)  Resp 20  Ht 5\' 2"  (1.575 m)  Wt 136 lb (61.689 kg)  BMI 24.87 kg/m2  SpO2 97%  Physical Exam  Nursing note and vitals reviewed. Constitutional: She is oriented to person, place, and time. She appears well-developed and well-nourished. No distress.  HENT:  Head: Normocephalic and atraumatic.  Eyes: EOM are normal.  Neck: Normal range of motion.  Cardiovascular:  Normal rate, regular rhythm and normal heart sounds.   Pulmonary/Chest: Effort normal and breath sounds normal. No respiratory distress. She has no wheezes. She has no rales. She exhibits tenderness. She exhibits no crepitus and no retraction.    Abdominal: Soft. She exhibits no distension. There is no tenderness.  Musculoskeletal: Normal range of motion.  Neurological: She is alert and oriented to person, place, and time.  Skin: Skin is warm and dry.  Psychiatric: She has a normal mood and affect. Judgment normal.    ED Course  Procedures (including critical care time)  Labs Reviewed - No data to display Dg Chest 2 View  02/21/2012  *RADIOLOGY REPORT*  Clinical Data: Cough and fever for 10 days  CHEST - 2 VIEW  Comparison: October 17, 2007  Findings: Lungs clear.  Heart size and pulmonary vascularity are normal.  No adenopathy.  No bone lesions.  IMPRESSION: No edema or consolidation.   Original Report Authenticated By: Bretta Bang, M.D.      1. Bronchitis       MDM   No PNA on CXR  rx-tussionex, 115 ml F/u with PCP        Evalina Field, PA 02/21/12 1153  Evalina Field, [place] 02/21/12 1651  Evalina Field, [place] 02/21/12 [phone]

## 2012-02-21 NOTE — ED Notes (Signed)
Cough, fever, ribs hurt.

## 2012-03-02 NOTE — ED Provider Notes (Signed)
Medical screening examination/treatment/procedure(s) were performed by non-physician practitioner and as supervising physician I was immediately available for consultation/collaboration.   Benny Lennert, MD 03/02/12 [phone]

## 2013-06-06 ENCOUNTER — Emergency Department (HOSPITAL_COMMUNITY): Payer: BC Managed Care – PPO

## 2013-06-06 ENCOUNTER — Encounter (HOSPITAL_COMMUNITY): Payer: Self-pay | Admitting: Emergency Medicine

## 2013-06-06 ENCOUNTER — Emergency Department (HOSPITAL_COMMUNITY)
Admission: EM | Admit: 2013-06-06 | Discharge: 2013-06-06 | Disposition: A | Discharge status: Home / Self Care | Payer: BC Managed Care – PPO | Attending: Emergency Medicine | Admitting: Emergency Medicine

## 2013-06-06 DIAGNOSIS — N809 Endometriosis, unspecified: Secondary | ICD-10-CM | POA: Insufficient documentation

## 2013-06-06 DIAGNOSIS — R109 Unspecified abdominal pain: Secondary | ICD-10-CM

## 2013-06-06 DIAGNOSIS — Z91040 Latex allergy status: Secondary | ICD-10-CM | POA: Insufficient documentation

## 2013-06-06 DIAGNOSIS — Z8739 Personal history of other diseases of the musculoskeletal system and connective tissue: Secondary | ICD-10-CM | POA: Insufficient documentation

## 2013-06-06 DIAGNOSIS — Z79899 Other long term (current) drug therapy: Secondary | ICD-10-CM | POA: Insufficient documentation

## 2013-06-06 DIAGNOSIS — R1013 Epigastric pain: Secondary | ICD-10-CM | POA: Insufficient documentation

## 2013-06-06 DIAGNOSIS — R1033 Periumbilical pain: Secondary | ICD-10-CM | POA: Insufficient documentation

## 2013-06-06 DIAGNOSIS — Z90711 Acquired absence of uterus with remaining cervical stump: Secondary | ICD-10-CM | POA: Insufficient documentation

## 2013-06-06 DIAGNOSIS — Z8669 Personal history of other diseases of the nervous system and sense organs: Secondary | ICD-10-CM | POA: Insufficient documentation

## 2013-06-06 DIAGNOSIS — K5900 Constipation, unspecified: Secondary | ICD-10-CM | POA: Insufficient documentation

## 2013-06-06 LAB — CBC WITH DIFFERENTIAL/PLATELET
Basophils Absolute: 0 10*3/uL (ref 0.0–0.1)
Basophils Relative: 0 % (ref 0–1)
Eosinophils Absolute: 0.3 10*3/uL (ref 0.0–0.7)
Eosinophils Relative: 3 % (ref 0–5)
HCT: 39.7 % (ref 36.0–46.0)
Hemoglobin: 13.4 g/dL (ref 12.0–15.0)
Lymphocytes Relative: 15 % (ref 12–46)
Lymphs Abs: 1.3 10*3/uL (ref 0.7–4.0)
MCH: 29.8 pg (ref 26.0–34.0)
MCHC: 33.8 g/dL (ref 30.0–36.0)
MCV: 88.4 fL (ref 78.0–100.0)
Monocytes Absolute: 0.6 10*3/uL (ref 0.1–1.0)
Monocytes Relative: 7 % (ref 3–12)
Neutro Abs: 6.6 10*3/uL (ref 1.7–7.7)
Neutrophils Relative %: 75 % (ref 43–77)
Platelets: 261 10*3/uL (ref 150–400)
RBC: 4.49 MIL/uL (ref 3.87–5.11)
RDW: 12.3 % (ref 11.5–15.5)
WBC: 8.8 10*3/uL (ref 4.0–10.5)

## 2013-06-06 LAB — URINALYSIS, ROUTINE W REFLEX MICROSCOPIC
Bilirubin Urine: NEGATIVE
Glucose, UA: NEGATIVE mg/dL
Hgb urine dipstick: NEGATIVE
Ketones, ur: NEGATIVE mg/dL
Leukocytes, UA: NEGATIVE
Nitrite: NEGATIVE
Protein, ur: NEGATIVE mg/dL
Specific Gravity, Urine: 1.015 (ref 1.005–1.030)
Urobilinogen, UA: 0.2 mg/dL (ref 0.0–1.0)
pH: 8 (ref 5.0–8.0)

## 2013-06-06 LAB — COMPREHENSIVE METABOLIC PANEL
ALT: 26 U/L (ref 0–35)
AST: 22 U/L (ref 0–37)
Albumin: 3.8 g/dL (ref 3.5–5.2)
Alkaline Phosphatase: 56 U/L (ref 39–117)
BUN: 12 mg/dL (ref 6–23)
CO2: 28 mEq/L (ref 19–32)
Calcium: 9 mg/dL (ref 8.4–10.5)
Chloride: 104 mEq/L (ref 96–112)
Creatinine, Ser: 0.64 mg/dL (ref 0.50–1.10)
GFR calc Af Amer: >90 mL/min (ref >90)
GFR calc non Af Amer: >90 mL/min (ref >90)
Glucose, Bld: 105 mg/dL — ABNORMAL HIGH (ref 70–99)
Potassium: 4 mEq/L (ref 3.7–5.3)
Sodium: 139 mEq/L (ref 137–147)
Total Bilirubin: 0.2 mg/dL — ABNORMAL LOW (ref 0.3–1.2)
Total Protein: 7.3 g/dL (ref 6.0–8.3)

## 2013-06-06 LAB — LIPASE, BLOOD: Lipase: 18 U/L (ref 11–59)

## 2013-06-06 MED ORDER — DIPHENHYDRAMINE HCL 50 MG/ML IJ SOLN
12.5000 mg | Freq: Once | INTRAMUSCULAR | Status: AC
  Administered 2013-06-06: 12.5 mg via INTRAVENOUS
  Filled 2013-06-06: qty 1

## 2013-06-06 MED ORDER — ONDANSETRON HCL 8 MG PO TABS: 8.0000 mg | ORAL_TABLET | ORAL | Status: DC | PRN

## 2013-06-06 MED ORDER — SODIUM CHLORIDE 0.9 % IV BOLUS (SEPSIS)
1000.0000 mL | Freq: Once | INTRAVENOUS | Status: AC
  Administered 2013-06-06: 1000 mL via INTRAVENOUS

## 2013-06-06 MED ORDER — ONDANSETRON HCL 4 MG/2ML IJ SOLN
4.0000 mg | Freq: Once | INTRAMUSCULAR | Status: AC
  Administered 2013-06-06: 4 mg via INTRAVENOUS
  Filled 2013-06-06: qty 2

## 2013-06-06 MED ORDER — FENTANYL CITRATE 0.05 MG/ML IJ SOLN
50.0000 ug | Freq: Once | INTRAMUSCULAR | Status: AC
  Administered 2013-06-06: 50 ug via INTRAVENOUS
  Filled 2013-06-06: qty 2

## 2013-06-06 NOTE — ED Notes (Signed)
Mid abd pain radiating to lower back x 3 days.  Reports no BM x 5 days.  Also reports n/v.  Currently being tx for endometriosis by OBGYN.

## 2013-06-06 NOTE — ED Provider Notes (Signed)
CSN: [id number]     Arrival date & time 06/06/13  1647 History  This chart was scribed for Donnetta HutchingBrian Miron Marxen, MD by Blanchard KelchNicole Curnes, ED Scribe. The patient was seen in room APA05/APA05. Patient's care was started at 5:28 PM.     Chief Complaint  Patient presents with  . Abdominal Pain     The history is provided by the patient. No language interpreter was used.    HPI Comments: [name] is a 38 y.o. female who presents to the Emergency Department complaining of constant periumbilical and epigastric tenderness that began three days ago. The pain radiates to her back. She states that she was [facility] on [facility] and was placed on norethindrone for endometriosis, diagnosed via Pelvic US. She has had constipation since being placed on the medication. Her last normal BM was four days ago. She has taken Ex-Lax and Magnesium Citrate for the constipation without relief. She has also had intermittent vomiting and has not been able to take her pain medication that was prescribed for the endometriosis, Dilaudid and Tramadol. She tried calling [facility] throughout the weekend for the symptoms but has been unable to get anyone on the phone. She has a pertinent surgical history of a partial hysterectomy but still has her ovaries.    Past Medical History  Diagnosis Date  . Seizures   . Arthritis   . Fibromyalgia    Past Surgical History  Procedure Laterality Date  . Vaginal hysterectomy    . Appendectomy    . Cholecystectomy     No family history on file. History  Substance Use Topics  . Smoking status: Never Smoker   . Smokeless tobacco: Not on file  . Alcohol Use: No   OB History   Grav Para Term Preterm Abortions TAB SAB Ect Mult Living                 Review of Systems A complete 10 system review of systems was obtained and all systems are negative except as noted in the HPI and PMH.     Allergies  Codeine; Hydrocodone; Latex; and Morphine and  related  Home Medications   Current Outpatient Rx  Name  Route  Sig  Dispense  Refill  . norethindrone (AYGESTIN) 5 MG tablet   Oral   Take 5 mg by mouth daily.         . ondansetron (ZOFRAN) 8 MG tablet   Oral   Take 1 tablet (8 mg total) by mouth every 4 (four) hours as needed for nausea or vomiting.   10 tablet   0    Triage Vitals: BP 144/84  Pulse 86  Temp(Src) 97.9 F (36.6 C) (Oral)  Resp 20  Ht 5\' 3"  (1.6 m)  Wt 140 lb (63.504 kg)  BMI 24.81 kg/m2  SpO2 100%  Physical Exam  Nursing note and vitals reviewed. Constitutional: She is oriented to person, place, and time. She appears well-developed and well-nourished.  HENT:  Head: Normocephalic and atraumatic.  Eyes: Conjunctivae and EOM are normal. Pupils are equal, round, and reactive to light.  Neck: Normal range of motion. Neck supple.  Cardiovascular: Normal rate, regular rhythm and normal heart sounds.   Pulmonary/Chest: Effort normal and breath sounds normal.  Abdominal: Soft. Bowel sounds are normal.  Periumbilical and epigastric tenderness.  Musculoskeletal: Normal range of motion.  Neurological: She is alert and oriented to person, place, and time.  Skin: Skin is warm  and dry.  Psychiatric: She has a normal mood and affect. Her behavior is normal.    ED Course  Procedures (including critical care time)  DIAGNOSTIC STUDIES: Oxygen Saturation is 100% on room air, normal by my interpretation.    COORDINATION OF CARE: 5:35 PM -Will order IV fluids, antiemetic and pain medication. Will order Abdomen w chest x-ray and CMP, CBC, blood lipase and UA labs. Patient verbalizes understanding and agrees with treatment plan.  Results for orders placed during the hospital encounter of 06/06/13  URINALYSIS, ROUTINE W REFLEX MICROSCOPIC      Result Value Ref Range   Color, Urine YELLOW  YELLOW   APPearance CLOUDY (*) CLEAR   Specific Gravity, Urine 1.015  1.005 - 1.030   pH 8.0  5.0 - 8.0   Glucose, UA  NEGATIVE  NEGATIVE mg/dL   Hgb urine dipstick NEGATIVE  NEGATIVE   Bilirubin Urine NEGATIVE  NEGATIVE   Ketones, ur NEGATIVE  NEGATIVE mg/dL   Protein, ur NEGATIVE  NEGATIVE mg/dL   Urobilinogen, UA 0.2  0.0 - 1.0 mg/dL   Nitrite NEGATIVE  NEGATIVE   Leukocytes, UA NEGATIVE  NEGATIVE  COMPREHENSIVE METABOLIC PANEL      Result Value Ref Range   Sodium 139  137 - 147 mEq/L   Potassium 4.0  3.7 - 5.3 mEq/L   Chloride 104  96 - 112 mEq/L   CO2 28  19 - 32 mEq/L   Glucose, Bld 105 (*) 70 - 99 mg/dL   BUN 12  6 - 23 mg/dL   Creatinine, Ser 1.61  0.50 - 1.10 mg/dL   Calcium 9.0  8.4 - 09.6 mg/dL   Total Protein 7.3  6.0 - 8.3 g/dL   Albumin 3.8  3.5 - 5.2 g/dL   AST 22  0 - 37 U/L   ALT 26  0 - 35 U/L   Alkaline Phosphatase 56  39 - 117 U/L   Total Bilirubin 0.2 (*) 0.3 - 1.2 mg/dL   GFR calc non Af Amer >90  >90 mL/min   GFR calc Af Amer >90  >90 mL/min  CBC WITH DIFFERENTIAL      Result Value Ref Range   WBC 8.8  4.0 - 10.5 K/uL   RBC 4.49  3.87 - 5.11 MIL/uL   Hemoglobin 13.4  12.0 - 15.0 g/dL   HCT 04.5  40.9 - 81.1 %   MCV 88.4  78.0 - 100.0 fL   MCH 29.8  26.0 - 34.0 pg   MCHC 33.8  30.0 - 36.0 g/dL   RDW 91.4  78.2 - 95.6 %   Platelets 261  150 - 400 K/uL   Neutrophils Relative % 75  43 - 77 %   Neutro Abs 6.6  1.7 - 7.7 K/uL   Lymphocytes Relative 15  12 - 46 %   Lymphs Abs 1.3  0.7 - 4.0 K/uL   Monocytes Relative 7  3 - 12 %   Monocytes Absolute 0.6  0.1 - 1.0 K/uL   Eosinophils Relative 3  0 - 5 %   Eosinophils Absolute 0.3  0.0 - 0.7 K/uL   Basophils Relative 0  0 - 1 %   Basophils Absolute 0.0  0.0 - 0.1 K/uL  LIPASE, BLOOD      Result Value Ref Range   Lipase 18  11 - 59 U/L    Dg Abd Acute W/chest  06/06/2013   CLINICAL DATA:  Abdominal pain, 3 day history of constipation  EXAM: ACUTE ABDOMEN SERIES (ABDOMEN 2 VIEW & CHEST 1 VIEW)  COMPARISON:  Prior chest x-ray 02/21/2012 ; prior CT abdomen and pelvis 10/17/2007  FINDINGS: No free air on the upright view.  No significant air-fluid levels. Gas is noted within a nondilated loop of small bowel in the left mid abdomen. There is a suggestion of mild thickening of the valvulae suggestive of thumb printing. Gas is noted throughout the colon to the level of the rectum. A small venous phleboliths project over the left anatomic pelvis. Evaluation of the renal shadows is limited by obscuring and overlying bowel gas. No radiopaque calculi or other significant radiographic abnormality is seen. Heart size and mediastinal contours are within normal limits. Surgical clips in the right upper quadrant suggest prior cholecystectomy. Both lungs are clear. No pneumothorax or pleural effusion. Unremarkable cardiac and mediastinal contours.  IMPRESSION: 1. No acute cardiopulmonary disease. 2. Borderline abnormal air-filled loop of small bowel in the left hemi abdomen with a suggestion of sub-mucosal thickening. Differential considerations include infectious enteritis, inflammatory enteritis, and potentially reactive ileus secondary to an adjacent intra-abdominal process.   Electronically Signed   By: Malachy Moan M.D.   On: 06/06/2013 18:39     EKG Interpretation None      MDM   Final diagnoses:  Abdominal pain    No acute abdomen. Patient feels better after IV fluids. White count, liver functions, lipase all normal. Recommend stopping hormonal replacement.  Rx Zofran 8 mg.  Patient has gynecological followup  I personally performed the services described in this documentation, which was scribed in my presence. The recorded information has been reviewed and is accurate.    [name], MD 06/06/13 2047

## 2013-06-06 NOTE — Discharge Instructions (Signed)
Stop new prescription medicine from gynecologist.   Prescription for nausea. Increase fluids. Followup your Dr.

## 2013-06-06 NOTE — ED Notes (Signed)
MD at bedside. 

## 2013-06-07 ENCOUNTER — Telehealth: Payer: Self-pay

## 2013-06-07 ENCOUNTER — Emergency Department (HOSPITAL_COMMUNITY): Payer: BC Managed Care – PPO

## 2013-06-07 ENCOUNTER — Emergency Department (HOSPITAL_COMMUNITY)
Admission: EM | Admit: 2013-06-07 | Discharge: 2013-06-07 | Disposition: A | Discharge status: Home / Self Care | Payer: BC Managed Care – PPO | Attending: Emergency Medicine | Admitting: Emergency Medicine

## 2013-06-07 ENCOUNTER — Encounter (HOSPITAL_COMMUNITY): Payer: Self-pay | Admitting: Emergency Medicine

## 2013-06-07 DIAGNOSIS — G43909 Migraine, unspecified, not intractable, without status migrainosus: Secondary | ICD-10-CM | POA: Insufficient documentation

## 2013-06-07 DIAGNOSIS — G8929 Other chronic pain: Secondary | ICD-10-CM | POA: Insufficient documentation

## 2013-06-07 DIAGNOSIS — Z8742 Personal history of other diseases of the female genital tract: Secondary | ICD-10-CM | POA: Insufficient documentation

## 2013-06-07 DIAGNOSIS — K644 Residual hemorrhoidal skin tags: Secondary | ICD-10-CM | POA: Insufficient documentation

## 2013-06-07 DIAGNOSIS — Z79899 Other long term (current) drug therapy: Secondary | ICD-10-CM | POA: Insufficient documentation

## 2013-06-07 DIAGNOSIS — R109 Unspecified abdominal pain: Secondary | ICD-10-CM

## 2013-06-07 DIAGNOSIS — R1084 Generalized abdominal pain: Secondary | ICD-10-CM | POA: Insufficient documentation

## 2013-06-07 DIAGNOSIS — Z91040 Latex allergy status: Secondary | ICD-10-CM | POA: Insufficient documentation

## 2013-06-07 DIAGNOSIS — Z8739 Personal history of other diseases of the musculoskeletal system and connective tissue: Secondary | ICD-10-CM | POA: Insufficient documentation

## 2013-06-07 DIAGNOSIS — R112 Nausea with vomiting, unspecified: Secondary | ICD-10-CM

## 2013-06-07 DIAGNOSIS — F410 Panic disorder [episodic paroxysmal anxiety] without agoraphobia: Secondary | ICD-10-CM | POA: Insufficient documentation

## 2013-06-07 HISTORY — DX: Endometriosis, unspecified: N80.9

## 2013-06-07 HISTORY — DX: Migraine, unspecified, not intractable, without status migrainosus: G43.909

## 2013-06-07 HISTORY — DX: Anxiety disorder, unspecified: F41.9

## 2013-06-07 LAB — COMPREHENSIVE METABOLIC PANEL
ALT: 22 U/L (ref 0–35)
AST: 18 U/L (ref 0–37)
Albumin: 3.8 g/dL (ref 3.5–5.2)
Alkaline Phosphatase: 63 U/L (ref 39–117)
BUN: 11 mg/dL (ref 6–23)
CO2: 26 mEq/L (ref 19–32)
Calcium: 9.3 mg/dL (ref 8.4–10.5)
Chloride: 101 mEq/L (ref 96–112)
Creatinine, Ser: 0.67 mg/dL (ref 0.50–1.10)
GFR calc Af Amer: >90 mL/min (ref >90)
GFR calc non Af Amer: >90 mL/min (ref >90)
Glucose, Bld: 172 mg/dL — ABNORMAL HIGH (ref 70–99)
Potassium: 4 mEq/L (ref 3.7–5.3)
Sodium: 138 mEq/L (ref 137–147)
Total Bilirubin: <0.2 mg/dL — ABNORMAL LOW (ref 0.3–1.2)
Total Protein: 7.5 g/dL (ref 6.0–8.3)

## 2013-06-07 LAB — URINE MICROSCOPIC-ADD ON

## 2013-06-07 LAB — URINALYSIS, ROUTINE W REFLEX MICROSCOPIC
Bilirubin Urine: NEGATIVE
Glucose, UA: NEGATIVE mg/dL
Ketones, ur: NEGATIVE mg/dL
Leukocytes, UA: NEGATIVE
Nitrite: NEGATIVE
Protein, ur: NEGATIVE mg/dL
Specific Gravity, Urine: 1.01 (ref 1.005–1.030)
Urobilinogen, UA: 0.2 mg/dL (ref 0.0–1.0)
pH: 7 (ref 5.0–8.0)

## 2013-06-07 LAB — CBC WITH DIFFERENTIAL/PLATELET
Basophils Absolute: 0 10*3/uL (ref 0.0–0.1)
Basophils Relative: 0 % (ref 0–1)
Eosinophils Absolute: 0.2 10*3/uL (ref 0.0–0.7)
Eosinophils Relative: 2 % (ref 0–5)
HCT: 38.4 % (ref 36.0–46.0)
Hemoglobin: 13 g/dL (ref 12.0–15.0)
Lymphocytes Relative: 18 % (ref 12–46)
Lymphs Abs: 1.8 10*3/uL (ref 0.7–4.0)
MCH: 30.3 pg (ref 26.0–34.0)
MCHC: 33.9 g/dL (ref 30.0–36.0)
MCV: 89.5 fL (ref 78.0–100.0)
Monocytes Absolute: 0.5 10*3/uL (ref 0.1–1.0)
Monocytes Relative: 5 % (ref 3–12)
Neutro Abs: 7.8 10*3/uL — ABNORMAL HIGH (ref 1.7–7.7)
Neutrophils Relative %: 75 % (ref 43–77)
Platelets: 278 10*3/uL (ref 150–400)
RBC: 4.29 MIL/uL (ref 3.87–5.11)
RDW: 12.4 % (ref 11.5–15.5)
WBC: 10.3 10*3/uL (ref 4.0–10.5)

## 2013-06-07 LAB — LIPASE, BLOOD: Lipase: 25 U/L (ref 11–59)

## 2013-06-07 MED ORDER — DICYCLOMINE HCL 10 MG/ML IM SOLN
20.0000 mg | Freq: Once | INTRAMUSCULAR | Status: AC
  Administered 2013-06-07: 20 mg via INTRAMUSCULAR
  Filled 2013-06-07: qty 2

## 2013-06-07 MED ORDER — IOHEXOL 300 MG/ML  SOLN
100.0000 mL | Freq: Once | INTRAMUSCULAR | Status: AC | PRN
Start: 2013-06-07 — End: 2013-06-07
  Administered 2013-06-07: 100 mL via INTRAVENOUS

## 2013-06-07 MED ORDER — PROMETHAZINE HCL 25 MG/ML IJ SOLN
12.5000 mg | Freq: Once | INTRAMUSCULAR | Status: AC
  Administered 2013-06-07: 12.5 mg via INTRAVENOUS
  Filled 2013-06-07: qty 1

## 2013-06-07 MED ORDER — IOHEXOL 300 MG/ML  SOLN
50.0000 mL | Freq: Once | INTRAMUSCULAR | Status: AC | PRN
  Administered 2013-06-07: 50 mL via ORAL

## 2013-06-07 MED ORDER — ONDANSETRON HCL 4 MG/2ML IJ SOLN: 4.0000 mg | Freq: Once | INTRAMUSCULAR | Status: DC

## 2013-06-07 MED ORDER — SODIUM CHLORIDE 0.9 % IV SOLN
INTRAVENOUS | Status: DC
  Administered 2013-06-07: 1000 mL via INTRAVENOUS

## 2013-06-07 MED ORDER — DICYCLOMINE HCL 20 MG PO TABS: 20.0000 mg | ORAL_TABLET | Freq: Four times a day (QID) | ORAL | Status: DC | PRN

## 2013-06-07 MED ORDER — FAMOTIDINE IN NACL 20-0.9 MG/50ML-% IV SOLN
20.0000 mg | Freq: Once | INTRAVENOUS | Status: AC
  Administered 2013-06-07: 20 mg via INTRAVENOUS
  Filled 2013-06-07: qty 50

## 2013-06-07 NOTE — Telephone Encounter (Signed)
Pt is calling today because she is having rectal bleeding(bright red) and abd pain(bloated) and nausea. When she eats it makes her stomach hurt worst and she is doubled over with pain. Please advise and her call back number is [phone].

## 2013-06-07 NOTE — ED Notes (Signed)
Pt repots was diagnosed with endometriosis and was put on progesterone recently.  Reports was having abd pain and lower back pain.  Reports was seen here yesterday and was told her bowel and intestines were inflamed.  Today pt says was feeling better until she tried to eat some chicken noodle soup.  Reports started having severe pain again and when had a bm had bright red blood in stool.

## 2013-06-07 NOTE — ED Provider Notes (Signed)
22:50- patient seen earlier by Dr. Clarene DukeMcManus, who asked me to evaluate her after CT imaging.  Medications  0.9 %  sodium chloride infusion (1,000 mLs Intravenous New Bag/Given 06/07/13 2025)  iohexol (OMNIPAQUE) 300 MG/ML solution 50 mL (50 mLs Oral Contrast Given 06/07/13 2041)  promethazine (PHENERGAN) injection 12.5 mg (12.5 mg Intravenous Given 06/07/13 2105)  dicyclomine (BENTYL) injection 20 mg (20 mg Intramuscular Given 06/07/13 2111)  famotidine (PEPCID) IVPB 20 mg (0 mg Intravenous Stopped 06/07/13 2144)  iohexol (OMNIPAQUE) 300 MG/ML solution 100 mL (100 mLs Intravenous Contrast Given 06/07/13 2151)    Patient Vitals for the past 24 hrs:  BP Temp Temp src Pulse Resp SpO2 Height Weight  06/07/13 2228 111/62 mmHg - - 78 16 99 % - -  06/07/13 2019 141/77 mmHg - - - - - - -  06/07/13 2018 152/101 mmHg - - 93 20 100 % - -  06/07/13 1633 149/90 mmHg 97.7 F (36.5 C) Oral 104 20 100 % 5\' 3"  (1.6 m) 143 lb (64.864 kg)    10:51 PM Reevaluation with update and discussion. After initial assessment and treatment, an updated evaluation reveals she is comfortable now, no additional c/o. She stopped Provera yesterday.  She would like to try a Rx for Bentyl.She has GI f/u scheduled for 06/09/13. Findings discussed with pt. And husband; all questions answered.Mancel Bale. Nakyra Bourn L     Ct Abdomen Pelvis W Contrast  06/07/2013   CLINICAL DATA:  Four day history of abdominal and lower back pain. History of endometriosis.  EXAM: CT ABDOMEN AND PELVIS WITH CONTRAST  TECHNIQUE: Multidetector CT imaging of the abdomen and pelvis was performed using the standard protocol following bolus administration of intravenous contrast.  CONTRAST:  50mL OMNIPAQUE IOHEXOL 300 MG/ML SOLN, 100mL OMNIPAQUE IOHEXOL 300 MG/ML SOLN  COMPARISON:  Prior CT abdomen/ pelvis 10/17/2007  FINDINGS: Lower Chest: The lung bases are clear. Visualized cardiac structures are within normal limits for size. No pericardial effusion. Unremarkable  visualized distal thoracic esophagus.  Abdomen: Unremarkable CT appearance of the stomach, duodenum, spleen, adrenal glands, pancreas and liver. Patient is status post cholecystectomy. No intra or extrahepatic biliary ductal dilatation.  Unremarkable appearance of the bilateral kidneys. No focal solid lesion, hydronephrosis or nephrolithiasis. Subcentimeter low-attenuation lesion in the interpolar right kidney is too small for accurate characterization but is statistically highly likely a benign cyst.  Surgical changes of prior appendectomy. No evidence of bowel obstruction or focal bowel wall thickening. No free fluid or suspicious adenopathy.  Pelvis: Surgical changes of prior hysterectomy. Dominant left ovarian follicle is within normal limits for reproductive age female. The right ovary is unremarkable. The bladder is unremarkable. No free fluid or suspicious adenopathy.  Bones/Soft Tissues: No acute fracture or aggressive appearing lytic or blastic osseous lesion.  Vascular: No significant atherosclerotic vascular disease, aneurysmal dilatation or acute abnormality.  IMPRESSION: 1. No acute abnormality in the abdomen or pelvis to explain the patient's clinical symptoms. 2. Surgical changes of prior hysterectomy, cholecystectomy and appendectomy.   Electronically Signed   By: Malachy MoanHeath  McCullough M.D.   On: 06/07/2013 22:37   Dg Abd Acute W/chest  06/06/2013   CLINICAL DATA:  Abdominal pain, 3 day history of constipation  EXAM: ACUTE ABDOMEN SERIES (ABDOMEN 2 VIEW & CHEST 1 VIEW)  COMPARISON:  Prior chest x-ray 02/21/2012 ; prior CT abdomen and pelvis 10/17/2007  FINDINGS: No free air on the upright view. No significant air-fluid levels. Gas is noted within a nondilated loop of small bowel in  the left mid abdomen. There is a suggestion of mild thickening of the valvulae suggestive of thumb printing. Gas is noted throughout the colon to the level of the rectum. A small venous phleboliths project over the left  anatomic pelvis. Evaluation of the renal shadows is limited by obscuring and overlying bowel gas. No radiopaque calculi or other significant radiographic abnormality is seen. Heart size and mediastinal contours are within normal limits. Surgical clips in the right upper quadrant suggest prior cholecystectomy. Both lungs are clear. No pneumothorax or pleural effusion. Unremarkable cardiac and mediastinal contours.  IMPRESSION: 1. No acute cardiopulmonary disease. 2. Borderline abnormal air-filled loop of small bowel in the left hemi abdomen with a suggestion of sub-mucosal thickening. Differential considerations include infectious enteritis, inflammatory enteritis, and potentially reactive ileus secondary to an adjacent intra-abdominal process.   Electronically Signed   By: Malachy Moan M.D.   On: 06/06/2013 18:39    Flint Melter, MD 06/07/13 2300

## 2013-06-07 NOTE — Discharge Instructions (Signed)
Abdominal Pain, Women °Abdominal (stomach, pelvic, or belly) pain can be caused by many things. It is important to tell your doctor: °· The location of the pain. °· Does it come and go or is it present all the time? °· Are there things that start the pain (eating certain foods, exercise)? °· Are there other symptoms associated with the pain (fever, nausea, vomiting, diarrhea)? °All of this is helpful to know when trying to find the cause of the pain. °CAUSES  °· Stomach: virus or bacteria infection, or ulcer. °· Intestine: appendicitis (inflamed appendix), regional ileitis (Crohn's disease), ulcerative colitis (inflamed colon), irritable bowel syndrome, diverticulitis (inflamed diverticulum of the colon), or cancer of the stomach or intestine. °· Gallbladder disease or stones in the gallbladder. °· Kidney disease, kidney stones, or infection. °· Pancreas infection or cancer. °· Fibromyalgia (pain disorder). °· Diseases of the female organs: °· Uterus: fibroid (non-cancerous) tumors or infection. °· Fallopian tubes: infection or tubal pregnancy. °· Ovary: cysts or tumors. °· Pelvic adhesions (scar tissue). °· Endometriosis (uterus lining tissue growing in the pelvis and on the pelvic organs). °· Pelvic congestion syndrome (female organs filling up with blood just before the menstrual period). °· Pain with the menstrual period. °· Pain with ovulation (producing an egg). °· Pain with an IUD (intrauterine device, birth control) in the uterus. °· Cancer of the female organs. °· Functional pain (pain not caused by a disease, may improve without treatment). °· Psychological pain. °· Depression. °DIAGNOSIS  °Your doctor will decide the seriousness of your pain by doing an examination. °· Blood tests. °· X-rays. °· Ultrasound. °· CT scan (computed tomography, special type of X-ray). °· MRI (magnetic resonance imaging). °· Cultures, for infection. °· Barium enema (dye inserted in the large intestine, to better view it with  X-rays). °· Colonoscopy (looking in intestine with a lighted tube). °· Laparoscopy (minor surgery, looking in abdomen with a lighted tube). °· Major abdominal exploratory surgery (looking in abdomen with a large incision). °TREATMENT  °The treatment will depend on the cause of the pain.  °· Many cases can be observed and treated at home. °· Over-the-counter medicines recommended by your caregiver. °· Prescription medicine. °· Antibiotics, for infection. °· Birth control pills, for painful periods or for ovulation pain. °· Hormone treatment, for endometriosis. °· Nerve blocking injections. °· Physical therapy. °· Antidepressants. °· Counseling with a psychologist or psychiatrist. °· Minor or major surgery. °HOME CARE INSTRUCTIONS  °· Do not take laxatives, unless directed by your caregiver. °· Take over-the-counter pain medicine only if ordered by your caregiver. Do not take aspirin because it can cause an upset stomach or bleeding. °· Try a clear liquid diet (broth or water) as ordered by your caregiver. Slowly move to a bland diet, as tolerated, if the pain is related to the stomach or intestine. °· Have a thermometer and take your temperature several times a day, and record it. °· Bed rest and sleep, if it helps the pain. °· Avoid sexual intercourse, if it causes pain. °· Avoid stressful situations. °· Keep your follow-up appointments and tests, as your caregiver orders. °· If the pain does not go away with medicine or surgery, you may try: °· Acupuncture. °· Relaxation exercises (yoga, meditation). °· Group therapy. °· Counseling. °SEEK MEDICAL CARE IF:  °· You notice certain foods cause stomach pain. °· Your home care treatment is not helping your pain. °· You need stronger pain medicine. °· You want your IUD removed. °· You feel faint or   lightheaded. °· You develop nausea and vomiting. °· You develop a rash. °· You are having side effects or an allergy to your medicine. °SEEK IMMEDIATE MEDICAL CARE IF:  °· Your  pain does not go away or gets worse. °· You have a fever. °· Your pain is felt only in portions of the abdomen. The right side could possibly be appendicitis. The left lower portion of the abdomen could be colitis or diverticulitis. °· You are passing blood in your stools (bright red or black tarry stools, with or without vomiting). °· You have blood in your urine. °· You develop chills, with or without a fever. °· You pass out. °MAKE SURE YOU:  °· Understand these instructions. °· Will watch your condition. °· Will get help right away if you are not doing well or get worse. °Document Released: 12/23/2006 Document Revised: 05/20/2011 Document Reviewed: 01/12/2009 °[employer]® Patient Information ©2014 [employer]. ° °

## 2013-06-07 NOTE — Telephone Encounter (Signed)
I don't see where we have seen her in the office. Would recommend urgent OV. Stay on clear liquids in the interim. To ED if worsening of symptoms.

## 2013-06-07 NOTE — ED Provider Notes (Signed)
CSN: [id number]     Arrival date & time 06/07/13  1626 History   First MD Initiated Contact with Patient 06/07/13 2018     Chief Complaint  Patient presents with  . Abdominal Pain      HPI Pt was seen at 2010.  Per pt, c/o gradual onset and persistence of constant generalized abd "pain" for the past 4 days.  Has been associated with multiple intermittent episodes of N/V.  Describes the abd pain as "bloating" and "cramping."  States her symptoms began after her OB/GYN MD started her on progesterone tabs 4 days ago for dx endometriosis. States she has been constipated for the past 4 days, took Ex-Llax and Magnesium Citrate without relief. States after she had a small BM today she noticed blood in the toilet and on the toilet paper. The patient has a significant history of being evaluated for this complaint in the ED yesterday. States she came back to the ED today because she "still felt bad." Denies rectal pain, no diarrhea, no fevers, no back pain, no rash, no CP/SOB, no black or blood in emesis. The symptoms have been associated with no other complaints.     Past Medical History  Diagnosis Date  . Arthritis   . Fibromyalgia   . Endometriosis   . Migraine headache     "since 38 yrs old"  . Panic attack   . Anxiety   . Chronic pelvic pain in female 2003  . 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 Chronic neck pain    Past Surgical History  Procedure Laterality Date  . Vaginal hysterectomy    . Appendectomy    . Cholecystectomy      History  Substance Use Topics  . Smoking status: Never Smoker   . Smokeless tobacco: Not on file  . Alcohol Use: No    Review of Systems ROS: Statement: All systems negative except as marked or noted in the HPI; Constitutional: Negative for fever and chills. ; ; Eyes: Negative for eye pain, redness and discharge. ; ; ENMT: Negative for ear pain, hoarseness, nasal congestion, sinus pressure and sore throat. ; ; Cardiovascular: Negative for  chest pain, palpitations, diaphoresis, dyspnea and peripheral edema. ; ; Respiratory: Negative for cough, wheezing and stridor. ; ; Gastrointestinal: Negative for diarrhea, hematemesis, jaundice and +N/V, abd pain, rectal bleeding. . ; ; Genitourinary: Negative for dysuria, flank pain and hematuria. ; ; Musculoskeletal: Negative for back pain and neck pain. Negative for swelling and trauma.; ; Skin: Negative for pruritus, rash, abrasions, blisters, bruising and skin lesion.; ; Neuro: Negative for headache, lightheadedness and neck stiffness. Negative for weakness, altered level of consciousness , altered mental status, extremity weakness, paresthesias, involuntary movement, seizure and syncope.      Allergies  Codeine; Hydrocodone; Latex; and Morphine and related  Home Medications   Current Outpatient Rx  Name  Route  Sig  Dispense  Refill  . magnesium hydroxide (MILK OF MAGNESIA) 400 MG/5ML suspension   Oral   Take 30 mLs by mouth once as needed for mild constipation.         . norethindrone (AYGESTIN) 5 MG tablet   Oral   Take 5 mg by mouth daily.         . ondansetron (ZOFRAN) 8 MG tablet   Oral   Take 1 tablet (8 mg total) by mouth every 4 (four) hours as needed for nausea or vomiting.   10 tablet   0   .  Sennosides (EX-LAX PO)   Oral   Take 1 tablet by mouth once as needed (for constipation).         . SUMAtriptan (IMITREX) 100 MG tablet   Oral   Take 100 mg by mouth every 2 (two) hours as needed for migraine or headache. May repeat in 2 hours if headache persists or recurs.          BP 141/77  Pulse 93  Temp(Src) 97.7 F (36.5 C) (Oral)  Resp 20  Ht 5\' 3"  (1.6 m)  Wt 143 lb (64.864 kg)  BMI 25.34 kg/m2  SpO2 100% Physical Exam 2015: Physical examination:  Nursing notes reviewed; Vital signs and O2 SAT reviewed;  Constitutional: Well developed, Well nourished, Well hydrated, Uncomfortable appearing.; Head:  Normocephalic, atraumatic; Eyes: EOMI, PERRL, No  scleral icterus; ENMT: Mouth and pharynx normal, Mucous membranes moist; Neck: Supple, Full range of motion, No lymphadenopathy; Cardiovascular: Regular rate and rhythm, No murmur, rub, or gallop; Respiratory: Breath sounds clear & equal bilaterally, No rales, rhonchi, wheezes.  Speaking full sentences with ease, Normal respiratory effort/excursion; Chest: Nontender, Movement normal; Abdomen: Soft, +diffusely tender to palp. No rebound or guarding. Nondistended, Normal bowel sounds. Rectal exam performed w/permission of pt and ED RN chaperone present.  Anal tone normal.  Non-tender, soft brown stool in rectal vault, heme neg.  No fissures, +tender external hemorrhoids without thrombosis or active bleeding, no palp masses.; Genitourinary: No CVA tenderness; Extremities: Pulses normal, No tenderness, No edema, No calf edema or asymmetry.; Neuro: AA&Ox3, Major CN grossly intact.  Speech clear. No gross focal motor or sensory deficits in extremities.; Skin: Color normal, Warm, Dry.   ED Course  Procedures     EKG Interpretation None      MDM  MDM Reviewed: previous chart, nursing note and vitals Reviewed previous: labs and x-ray Interpretation: labs    Results for orders placed during the hospital encounter of 06/07/13  URINALYSIS, ROUTINE W REFLEX MICROSCOPIC      Result Value Ref Range   Color, Urine STRAW (*) YELLOW   APPearance CLEAR  CLEAR   Specific Gravity, Urine 1.010  1.005 - 1.030   pH 7.0  5.0 - 8.0   Glucose, UA NEGATIVE  NEGATIVE mg/dL   Hgb urine dipstick TRACE (*) NEGATIVE   Bilirubin Urine NEGATIVE  NEGATIVE   Ketones, ur NEGATIVE  NEGATIVE mg/dL   Protein, ur NEGATIVE  NEGATIVE mg/dL   Urobilinogen, UA 0.2  0.0 - 1.0 mg/dL   Nitrite NEGATIVE  NEGATIVE   Leukocytes, UA NEGATIVE  NEGATIVE  CBC WITH DIFFERENTIAL      Result Value Ref Range   WBC 10.3  4.0 - 10.5 K/uL   RBC 4.29  3.87 - 5.11 MIL/uL   Hemoglobin 13.0  12.0 - 15.0 g/dL   HCT 16.1  09.6 - 04.5 %   MCV  89.5  78.0 - 100.0 fL   MCH 30.3  26.0 - 34.0 pg   MCHC 33.9  30.0 - 36.0 g/dL   RDW 40.9  81.1 - 91.4 %   Platelets 278  150 - 400 K/uL   Neutrophils Relative % 75  43 - 77 %   Neutro Abs 7.8 (*) 1.7 - 7.7 K/uL   Lymphocytes Relative 18  12 - 46 %   Lymphs Abs 1.8  0.7 - 4.0 K/uL   Monocytes Relative 5  3 - 12 %   Monocytes Absolute 0.5  0.1 - 1.0 K/uL   Eosinophils Relative 2  0 - 5 %   Eosinophils Absolute 0.2  0.0 - 0.7 K/uL   Basophils Relative 0  0 - 1 %   Basophils Absolute 0.0  0.0 - 0.1 K/uL  COMPREHENSIVE METABOLIC PANEL      Result Value Ref Range   Sodium 138  137 - 147 mEq/L   Potassium 4.0  3.7 - 5.3 mEq/L   Chloride 101  96 - 112 mEq/L   CO2 26  19 - 32 mEq/L   Glucose, Bld 172 (*) 70 - 99 mg/dL   BUN 11  6 - 23 mg/dL   Creatinine, Ser 1.61  0.50 - 1.10 mg/dL   Calcium 9.3  8.4 - 09.6 mg/dL   Total Protein 7.5  6.0 - 8.3 g/dL   Albumin 3.8  3.5 - 5.2 g/dL   AST 18  0 - 37 U/L   ALT 22  0 - 35 U/L   Alkaline Phosphatase 63  39 - 117 U/L   Total Bilirubin <0.2 (*) 0.3 - 1.2 mg/dL   GFR calc non Af Amer >90  >90 mL/min   GFR calc Af Amer >90  >90 mL/min  LIPASE, BLOOD      Result Value Ref Range   Lipase 25  11 - 59 U/L  URINE MICROSCOPIC-ADD ON      Result Value Ref Range   Squamous Epithelial / LPF FEW (*) RARE   WBC, UA 0-2  <3 WBC/hpf   RBC / HPF 0-2  <3 RBC/hpf   Bacteria, UA RARE  RARE   Dg Abd Acute W/chest 06/06/2013   CLINICAL DATA:  Abdominal pain, 3 day history of constipation  EXAM: ACUTE ABDOMEN SERIES (ABDOMEN 2 VIEW & CHEST 1 VIEW)  COMPARISON:  Prior chest x-ray 02/21/2012 ; prior CT abdomen and pelvis 10/17/2007  FINDINGS: No free air on the upright view. No significant air-fluid levels. Gas is noted within a nondilated loop of small bowel in the left mid abdomen. There is a suggestion of mild thickening of the valvulae suggestive of thumb printing. Gas is noted throughout the colon to the level of the rectum. A small venous phleboliths project  over the left anatomic pelvis. Evaluation of the renal shadows is limited by obscuring and overlying bowel gas. No radiopaque calculi or other significant radiographic abnormality is seen. Heart size and mediastinal contours are within normal limits. Surgical clips in the right upper quadrant suggest prior cholecystectomy. Both lungs are clear. No pneumothorax or pleural effusion. Unremarkable cardiac and mediastinal contours.  IMPRESSION: 1. No acute cardiopulmonary disease. 2. Borderline abnormal air-filled loop of small bowel in the left hemi abdomen with a suggestion of sub-mucosal thickening. Differential considerations include infectious enteritis, inflammatory enteritis, and potentially reactive ileus secondary to an adjacent intra-abdominal process.   Electronically Signed   By: Malachy Moan M.D.   On: 06/06/2013 18:39    2210:  Pt's 2nd ED visit in 2 days for abd pain, N/V. CT A/P pending. Sign out to Dr. Effie Shy.   Laray Anger, DO 06/07/13 2214

## 2013-06-07 NOTE — Telephone Encounter (Signed)
Tried to call with no answer  

## 2013-06-07 NOTE — ED Notes (Signed)
Pt alert & oriented x4, stable gait. Patient given discharge instructions, paperwork & prescription(s). Patient  instructed to stop at the registration desk to finish any additional paperwork. Patient verbalized understanding. Pt left department w/ no further questions. 

## 2013-06-08 LAB — POC OCCULT BLOOD, ED: Fecal Occult Bld: NEGATIVE

## 2013-06-09 ENCOUNTER — Ambulatory Visit: Payer: BC Managed Care – PPO | Admitting: Gastroenterology

## 2013-06-14 NOTE — Telephone Encounter (Signed)
Pt when to the ER on 06/07/13

## 2013-06-25 ENCOUNTER — Telehealth: Payer: Self-pay | Admitting: Gastroenterology

## 2013-06-25 ENCOUNTER — Ambulatory Visit: Payer: BC Managed Care – PPO | Admitting: Gastroenterology

## 2013-06-25 NOTE — Telephone Encounter (Signed)
Pt was a no show

## 2014-02-21 ENCOUNTER — Other Ambulatory Visit: Payer: Self-pay

## 2014-02-21 DIAGNOSIS — N644 Mastodynia: Secondary | ICD-10-CM

## 2014-02-21 DIAGNOSIS — R208 Other disturbances of skin sensation: Secondary | ICD-10-CM

## 2014-02-28 DIAGNOSIS — M797 Fibromyalgia: Secondary | ICD-10-CM | POA: Insufficient documentation

## 2014-02-28 DIAGNOSIS — G43909 Migraine, unspecified, not intractable, without status migrainosus: Secondary | ICD-10-CM | POA: Insufficient documentation

## 2014-03-02 ENCOUNTER — Other Ambulatory Visit: Payer: Self-pay | Admitting: Physician Assistant

## 2014-03-02 DIAGNOSIS — N644 Mastodynia: Secondary | ICD-10-CM

## 2014-03-02 DIAGNOSIS — R208 Other disturbances of skin sensation: Secondary | ICD-10-CM

## 2014-04-13 ENCOUNTER — Other Ambulatory Visit: Payer: Self-pay | Admitting: Physician Assistant

## 2014-04-13 DIAGNOSIS — N644 Mastodynia: Secondary | ICD-10-CM

## 2014-07-09 ENCOUNTER — Encounter (HOSPITAL_COMMUNITY): Payer: Self-pay | Admitting: Emergency Medicine

## 2014-07-09 ENCOUNTER — Emergency Department (HOSPITAL_COMMUNITY)
Admission: EM | Admit: 2014-07-09 | Discharge: 2014-07-09 | Disposition: A | Discharge status: Home / Self Care | Payer: BLUE CROSS/BLUE SHIELD | Attending: Emergency Medicine | Admitting: Emergency Medicine

## 2014-07-09 DIAGNOSIS — X58XXXA Exposure to other specified factors, initial encounter: Secondary | ICD-10-CM | POA: Insufficient documentation

## 2014-07-09 DIAGNOSIS — S4991XA Unspecified injury of right shoulder and upper arm, initial encounter: Secondary | ICD-10-CM | POA: Diagnosis present

## 2014-07-09 DIAGNOSIS — F410 Panic disorder [episodic paroxysmal anxiety] without agoraphobia: Secondary | ICD-10-CM | POA: Diagnosis not present

## 2014-07-09 DIAGNOSIS — M1990 Unspecified osteoarthritis, unspecified site: Secondary | ICD-10-CM | POA: Diagnosis not present

## 2014-07-09 DIAGNOSIS — S46911A Strain of unspecified muscle, fascia and tendon at shoulder and upper arm level, right arm, initial encounter: Secondary | ICD-10-CM | POA: Insufficient documentation

## 2014-07-09 DIAGNOSIS — Y9389 Activity, other specified: Secondary | ICD-10-CM | POA: Insufficient documentation

## 2014-07-09 DIAGNOSIS — M542 Cervicalgia: Secondary | ICD-10-CM | POA: Diagnosis not present

## 2014-07-09 DIAGNOSIS — Y9289 Other specified places as the place of occurrence of the external cause: Secondary | ICD-10-CM | POA: Insufficient documentation

## 2014-07-09 DIAGNOSIS — Y998 Other external cause status: Secondary | ICD-10-CM | POA: Insufficient documentation

## 2014-07-09 DIAGNOSIS — Z87448 Personal history of other diseases of urinary system: Secondary | ICD-10-CM | POA: Diagnosis not present

## 2014-07-09 DIAGNOSIS — R569 Unspecified convulsions: Secondary | ICD-10-CM | POA: Insufficient documentation

## 2014-07-09 DIAGNOSIS — Z91040 Latex allergy status: Secondary | ICD-10-CM | POA: Diagnosis not present

## 2014-07-09 DIAGNOSIS — G8929 Other chronic pain: Secondary | ICD-10-CM | POA: Insufficient documentation

## 2014-07-09 DIAGNOSIS — G43909 Migraine, unspecified, not intractable, without status migrainosus: Secondary | ICD-10-CM | POA: Diagnosis not present

## 2014-07-09 DIAGNOSIS — Z79899 Other long term (current) drug therapy: Secondary | ICD-10-CM | POA: Insufficient documentation

## 2014-07-09 MED ORDER — CELECOXIB 100 MG PO CAPS: 100.0000 mg | ORAL_CAPSULE | Freq: Two times a day (BID) | ORAL | Status: DC

## 2014-07-09 MED ORDER — DEXAMETHASONE SODIUM PHOSPHATE 4 MG/ML IJ SOLN
8.0000 mg | Freq: Once | INTRAMUSCULAR | Status: AC
  Administered 2014-07-09: 8 mg via INTRAMUSCULAR
  Filled 2014-07-09: qty 2

## 2014-07-09 MED ORDER — KETOROLAC TROMETHAMINE 10 MG PO TABS
10.0000 mg | ORAL_TABLET | Freq: Once | ORAL | Status: AC
  Administered 2014-07-09: 10 mg via ORAL
  Filled 2014-07-09: qty 1

## 2014-07-09 MED ORDER — DIAZEPAM 5 MG PO TABS
10.0000 mg | ORAL_TABLET | Freq: Once | ORAL | Status: AC
  Administered 2014-07-09: 10 mg via ORAL
  Filled 2014-07-09: qty 2

## 2014-07-09 MED ORDER — DIAZEPAM 5 MG PO TABS: 5.0000 mg | ORAL_TABLET | Freq: Three times a day (TID) | ORAL | Status: DC

## 2014-07-09 NOTE — ED Provider Notes (Signed)
CSN: [id number]     Arrival date & time 07/09/14  1257 History   First MD Initiated Contact with Patient 07/09/14 1455     Chief Complaint  Patient presents with  . Torticollis     (Consider location/radiation/quality/duration/timing/severity/associated sxs/prior Treatment) Patient is a 39 y.o. female presenting with shoulder pain. The history is provided by the patient.  Shoulder Pain Location:  Shoulder (right neck) Time since incident:  2 days Injury: yes   Mechanism of injury comment:  Pt's dog jerked  a chain pt was holding and injured the right shoulder  Shoulder location:  R shoulder Pain details:    Quality:  Sharp and throbbing   Radiates to: right neck.   Severity:  Severe   Onset quality:  Sudden   Duration:  2 days   Timing:  Intermittent   Progression:  Worsening Chronicity:  New Handedness:  Right-handed Dislocation: no   Prior injury to area:  No Relieved by:  Nothing Worsened by:  Movement Ineffective treatments:  Ice and acetaminophen Associated symptoms: neck pain, stiffness and tingling   Associated symptoms: no numbness   Risk factors: no frequent fractures and no recent illness     Past Medical History  Diagnosis Date  . Arthritis   . Fibromyalgia   . Endometriosis   . Migraine headache     "since 39 yrs old"  . Panic attack   . Anxiety   . Chronic pelvic pain in female 2003  . 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 Chronic neck pain    Past Surgical History  Procedure Laterality Date  . Vaginal hysterectomy    . Appendectomy    . Cholecystectomy     History reviewed. No pertinent family history. History  Substance Use Topics  . Smoking status: Never Smoker   . Smokeless tobacco: Not on file  . Alcohol Use: No   OB History    Gravida Para Term Preterm AB TAB SAB Ectopic Multiple Living            2     Review of Systems  Musculoskeletal: Positive for arthralgias, stiffness and neck pain.  Neurological: Positive for  seizures and headaches.  Psychiatric/Behavioral: The patient is nervous/anxious.   All other systems reviewed and are negative.     Allergies  Codeine; Hydrocodone; Latex; and Morphine and related  Home Medications   Prior to Admission medications   Medication Sig Start Date End Date Taking? Authorizing Provider  Ascorbic Acid (VITAMIN C PO) Take 1 tablet by mouth daily.   Yes Historical Provider, MD  Cholecalciferol (VITAMIN D PO) Take 1 tablet by mouth daily.   Yes Historical Provider, MD  Coenzyme Q10 (CO Q 10 PO) Take 1 tablet by mouth daily.   Yes Historical Provider, MD  cyclobenzaprine (FLEXERIL) 10 MG tablet Take 10 mg by mouth once.   Yes Historical Provider, MD  Multiple Vitamins-Minerals (ANTIOXIDANT PO) Take 1 tablet by mouth daily.   Yes Historical Provider, MD  SUMAtriptan (IMITREX) 100 MG tablet Take 50 mg by mouth every 2 (two) hours as needed for migraine or headache. May repeat in 2 hours if headache persists or recurs.   Yes Historical Provider, MD  dicyclomine (BENTYL) 20 MG tablet Take 1 tablet (20 mg total) by mouth 4 (four) times daily as needed for spasms. Patient not taking: Reported on 07/09/2014 06/07/13   Mancel BaleElliott Wentz, MD  ondansetron (ZOFRAN) 8 MG tablet Take 1 tablet (8 mg total)  by mouth every 4 (four) hours as needed for nausea or vomiting. Patient not taking: Reported on 07/09/2014 06/06/13   [name], MD   BP 138/76 mmHg  Pulse 87  Temp(Src) 97.8 F (36.6 C) (Oral)  Resp 18  Ht  (1.6 m)  Wt 132 lb (59.875 kg)  BMI 23.39 kg/m2  SpO2 100% Physical Exam  Constitutional: She is oriented to person, place, and time. She appears well-developed and well-nourished.  Non-toxic appearance.  HENT:  Head: Normocephalic.  Right Ear: Tympanic membrane and external ear normal.  Left Ear: Tympanic membrane and external ear normal.  Eyes: EOM and lids are normal. Pupils are equal, round, and reactive to light.  Neck: Neck supple. Muscular tenderness  present. No spinous process tenderness present. Carotid bruit is not present. No rigidity. Decreased range of motion present. No edema and no erythema present.    Cardiovascular: Normal rate, regular rhythm, normal heart sounds, intact distal pulses and normal pulses.   Pulmonary/Chest: Breath sounds normal. No respiratory distress.  Abdominal: Soft. Bowel sounds are normal. There is no tenderness. There is no guarding.  Musculoskeletal:       Right shoulder: She exhibits tenderness, bony tenderness and pain. She exhibits no swelling, no effusion and no deformity.       Arms: FROM of the fingers, wrist, and elbow of the right upper extremity. Pain to palpation of the anterior right shoulder and posterior at the upper trapezius. No  Scapula deformity, but soreness just below the scapula. Pain with adduction and abduction of the right shoulder. Pain at the gleno-humeral area. FROM of the left upper extremity.  Lymphadenopathy:       Head (right side): No submandibular adenopathy present.       Head (left side): No submandibular adenopathy present.    She has no cervical adenopathy.  Neurological: She is alert and oriented to person, place, and time. She has normal strength. No cranial nerve deficit or sensory deficit.  Skin: Skin is warm and dry.  Psychiatric: She has a normal mood and affect. Her speech is normal.  Nursing note and vitals reviewed.   ED Course  Procedures (including critical care time) Labs Review Labs Reviewed - No data to display  Imaging Review No results found.   EKG Interpretation None      MDM  Vital signs non-acute. No gross neuro-vascular deficit. No deformity noted of the right shoulder. No evidence for dislocation. Grip symmetrical. No shoulder droop. Suspect Shoulder strain. Concerned for rotator cuff injury with anterior and posterior tenderness. Pt to follow up with orthopedics. Rx for valium tid and celebrex bid given to the patient. Pt to return to  ED if any emergent changes or problem.   Final diagnoses:  None    *I have reviewed nursing notes, vital signs, and all appropriate lab and imaging results for this patient.Ivery Quale, PA-C 07/10/14 2255  Derwood Kaplan, MD 07/12/14 [phone]

## 2014-07-09 NOTE — ED Notes (Signed)
Pt from home. Pt is a dog walker. A dog pulled the leash Thursday and when she woke up Friday morning, she was in intense pain. She has not taken tylenol nor advil today. The pain is in her right shoulder and is exacerbated by turning her head.

## 2014-07-09 NOTE — Discharge Instructions (Signed)
Please use a heating pad to your neck and shoulder tonight and tomorrow. Please use diazepam 3 times daily, and Celebrex 2 times daily with food. Diazepam may cause drowsiness, please do not drink alcohol, operate a vehicle, operating machinery, but this patient activities requiring concentration while taking this medication. Please see Dr. Jenny Reichmann for orthopedic referral if not improving. Shoulder Sprain A shoulder sprain is the result of damage to the tough, fiber-like tissues (ligaments) that help hold your shoulder in place. The ligaments may be stretched or torn. Besides the main shoulder joint (the ball and socket), there are several smaller joints that connect the bones in this area. A sprain usually involves one of those joints. Most often it is the acromioclavicular (or AC) joint. That is the joint that connects the collarbone (clavicle) and the shoulder blade (scapula) at the top point of the shoulder blade (acromion). A shoulder sprain is a mild form of what is called a shoulder separation. Recovering from a shoulder sprain may take some time. For some, pain lingers for several months. Most people recover without long term problems. CAUSES   A shoulder sprain is usually caused by some kind of trauma. This might be:  Falling on an outstretched arm.  Being hit hard on the shoulder.  Twisting the arm.  Shoulder sprains are more likely to occur in people who:  Play sports.  Have balance or coordination problems. SYMPTOMS   Pain when you move your shoulder.  Limited ability to move the shoulder.  Swelling and tenderness on top of the shoulder.  Redness or warmth in the shoulder.  Bruising.  A change in the shape of the shoulder. DIAGNOSIS  Your healthcare provider may:  Ask about your symptoms.  Ask about recent activity that might have caused those symptoms.  Examine your shoulder. You may be asked to do simple exercises to test movement. The other shoulder will be  examined for comparison.  Order some tests that provide a look inside the body. They can show the extent of the injury. The tests could include:  X-rays.  CT (computed tomography) scan.  MRI (magnetic resonance imaging) scan. RISKS AND COMPLICATIONS  Loss of full shoulder motion.  Ongoing shoulder pain. TREATMENT  How long it takes to recover from a shoulder sprain depends on how severe it was. Treatment options may include:  Rest. You should not use the arm or shoulder until it heals.  Ice. For 2 or 3 days after the injury, put an ice pack on the shoulder up to 4 times a day. It should stay on for 15 to 20 minutes each time. Wrap the ice in a towel so it does not touch your skin.  Over-the-counter medicine to relieve pain.  A sling or brace. This will keep the arm still while the shoulder is healing.  Physical therapy or rehabilitation exercises. These will help you regain strength and motion. Ask your healthcare provider when it is OK to begin these exercises.  Surgery. The need for surgery is rare with a sprained shoulder, but some people may need surgery to keep the joint in place and reduce pain. HOME CARE INSTRUCTIONS   Ask your healthcare provider about what you should and should not do while your shoulder heals.  Make sure you know how to apply ice to the correct area of your shoulder.  Talk with your healthcare provider about which medications should be used for pain and swelling.  If rehabilitation therapy will be needed, ask your healthcare  provider to refer you to a therapist. If it is not recommended, then ask about at-home exercises. Find out when exercise should begin. SEEK MEDICAL CARE IF:  Your pain, swelling, or redness at the joint increases. SEEK IMMEDIATE MEDICAL CARE IF:   You have a fever.  You cannot move your arm or shoulder. Document Released: 07/14/2008 Document Revised: 05/20/2011 Document Reviewed: 07/14/2008 [facility] Patient Information  2015 [place], [place]. This information is not intended to replace advice given to you by your health care provider. Make sure you discuss any questions you have with your health care provider.

## 2014-07-10 ENCOUNTER — Emergency Department (HOSPITAL_COMMUNITY)
Admission: EM | Admit: 2014-07-10 | Discharge: 2014-07-10 | Disposition: A | Discharge status: Home / Self Care | Payer: BLUE CROSS/BLUE SHIELD | Attending: Emergency Medicine | Admitting: Emergency Medicine

## 2014-07-10 ENCOUNTER — Encounter (HOSPITAL_COMMUNITY): Payer: Self-pay | Admitting: *Deleted

## 2014-07-10 DIAGNOSIS — Z79899 Other long term (current) drug therapy: Secondary | ICD-10-CM | POA: Diagnosis not present

## 2014-07-10 DIAGNOSIS — G43909 Migraine, unspecified, not intractable, without status migrainosus: Secondary | ICD-10-CM | POA: Diagnosis not present

## 2014-07-10 DIAGNOSIS — G8929 Other chronic pain: Secondary | ICD-10-CM | POA: Diagnosis not present

## 2014-07-10 DIAGNOSIS — Z91040 Latex allergy status: Secondary | ICD-10-CM | POA: Diagnosis not present

## 2014-07-10 DIAGNOSIS — Z8742 Personal history of other diseases of the female genital tract: Secondary | ICD-10-CM | POA: Diagnosis not present

## 2014-07-10 DIAGNOSIS — M1990 Unspecified osteoarthritis, unspecified site: Secondary | ICD-10-CM | POA: Insufficient documentation

## 2014-07-10 DIAGNOSIS — M797 Fibromyalgia: Secondary | ICD-10-CM | POA: Diagnosis not present

## 2014-07-10 DIAGNOSIS — F410 Panic disorder [episodic paroxysmal anxiety] without agoraphobia: Secondary | ICD-10-CM | POA: Insufficient documentation

## 2014-07-10 DIAGNOSIS — M25511 Pain in right shoulder: Secondary | ICD-10-CM | POA: Insufficient documentation

## 2014-07-10 DIAGNOSIS — G40909 Epilepsy, unspecified, not intractable, without status epilepticus: Secondary | ICD-10-CM | POA: Diagnosis not present

## 2014-07-10 MED ORDER — HYDROMORPHONE HCL 1 MG/ML IJ SOLN
1.0000 mg | Freq: Once | INTRAMUSCULAR | Status: AC
  Administered 2014-07-10: 1 mg via INTRAMUSCULAR
  Filled 2014-07-10: qty 1

## 2014-07-10 MED ORDER — CYCLOBENZAPRINE HCL 10 MG PO TABS
10.0000 mg | ORAL_TABLET | Freq: Three times a day (TID) | ORAL | Status: DC | PRN
Start: 2014-07-10 — End: 2014-11-18

## 2014-07-10 MED ORDER — PREDNISONE 20 MG PO TABS: ORAL_TABLET | ORAL | Status: DC

## 2014-07-10 NOTE — ED Notes (Signed)
Pt complains of right shoulder pain since Thursday. Pt was seen [facility] yesterday, was prescribed valium and celebrex. Pt states she is unable to fill her prescription for celebrex. Pt last took valium this morning at 730AM. Pt states she has tried ice and heat with no relief.

## 2014-07-10 NOTE — ED Provider Notes (Signed)
CSN: [id number]     Arrival date & time 07/10/14  1223 History  This chart was scribed for non-physician practitioner [name], PA, working with [name], MD, by Tanda Rockers, ED Scribe. This patient was seen in room WTR6/WTR6 and the patient's care was started at 12:36 PM.    Chief Complaint  Patient presents with  . Shoulder Pain   The history is provided by the patient. No language interpreter was used.     HPI Comments: [name] is a 39 y.o. female who is right hand dominant presents to the Emergency Department complaining of right shoulder pain that began Thursday, 4/28. She states that she was walking a dog the other day and beleves the dog could have pulled the leash too hard, causing the pain but she denies any obvious injury or trauma to the shoulder. She describes it as a dull, aching pain. She states that the pain radiates to her forearm and hand. She also mentions that the pain has been causing a migraine flare up. Pt has been using a heating pad without relief. She has also taken left over Tramadol and muscle relaxer with no relief. Pt was seen at [facility] 1 day ago. She was given 2 Valium in the ED which she states did also not relieve her pain. She denies fevers or any other symptoms. Pt is planning on making an appointment with [facility] on Monday.    Past Medical History  Diagnosis Date  . Arthritis   . Fibromyalgia   . Endometriosis   . Migraine headache     "since 39 yrs old"  . Panic attack   . Anxiety   . Chronic pelvic pain in female 2003  . 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 Chronic neck pain    Past Surgical History  Procedure Laterality Date  . Vaginal hysterectomy    . Appendectomy    . Cholecystectomy     No family history on file. History  Substance Use Topics  . Smoking status: Never Smoker   . Smokeless tobacco: Not on file  . Alcohol Use: No   OB History    Gravida Para Term Preterm AB TAB SAB Ectopic  Multiple Living            2     Review of Systems  Constitutional: Negative for fever.  Musculoskeletal: Positive for arthralgias (Right shoulder pain ).  Neurological: Positive for headaches.      Allergies  Codeine; Hydrocodone; Latex; and Morphine and related  Home Medications   Prior to Admission medications   Medication Sig Start Date End Date Taking? Authorizing Provider  Ascorbic Acid (VITAMIN C PO) Take 1 tablet by mouth daily.    Historical Provider, MD  celecoxib (CELEBREX) 100 MG capsule Take 1 capsule (100 mg total) by mouth 2 (two) times daily. 07/09/14   Ivery Quale, PA-C  Cholecalciferol (VITAMIN D PO) Take 1 tablet by mouth daily.    Historical Provider, MD  Coenzyme Q10 (CO Q 10 PO) Take 1 tablet by mouth daily.    Historical Provider, MD  cyclobenzaprine (FLEXERIL) 10 MG tablet Take 10 mg by mouth once.    Historical Provider, MD  diazepam (VALIUM) 5 MG tablet Take 1 tablet (5 mg total) by mouth 3 (three) times daily. 07/09/14   Ivery Quale, PA-C  dicyclomine (BENTYL) 20 MG tablet Take 1 tablet (20 mg total) by mouth 4 (four) times daily as needed for  spasms. Patient not taking: Reported on 07/09/2014 06/07/13   Mancel BaleElliott Wentz, MD  Multiple Vitamins-Minerals (ANTIOXIDANT PO) Take 1 tablet by mouth daily.    Historical Provider, MD  ondansetron (ZOFRAN) 8 MG tablet Take 1 tablet (8 mg total) by mouth every 4 (four) hours as needed for nausea or vomiting. Patient not taking: Reported on 07/09/2014 06/06/13   Donnetta HutchingBrian Cook, MD  SUMAtriptan (IMITREX) 100 MG tablet Take 50 mg by mouth every 2 (two) hours as needed for migraine or headache. May repeat in 2 hours if headache persists or recurs.    Historical Provider, MD   Triage Vitals: BP 146/81 mmHg  Pulse 99  Temp(Src) 99 F (37.2 C) (Oral)  Resp 18  SpO2 100%   Physical Exam  Constitutional: She is oriented to person, place, and time. She appears well-developed and well-nourished. No distress.  HENT:  Head:  Normocephalic and atraumatic.  Eyes: Conjunctivae and EOM are normal.  Neck: Neck supple. No tracheal deviation present.  Cardiovascular: Normal rate.   Pulmonary/Chest: Effort normal. No respiratory distress.  Musculoskeletal: Normal range of motion.  No significant midline spine tenderness.  Tenderness noted along right trapezius muscle.  Tenderness noted to the [facility] joints and the glenohemurial joint. Tenderness noted to the right deltoid.  Increasing pain with arm extension and abduction. Positive Hawkins test.  Radial pulses 2+. Right wrist with normal ROM.  Right elbow with full ROM.   Neurological: She is alert and oriented to person, place, and time.  Skin: Skin is warm and dry.  Psychiatric: She has a normal mood and affect. Her behavior is normal.  Nursing note and vitals reviewed.   ED Course  Procedures (including critical care time)  DIAGNOSTIC STUDIES: Oxygen Saturation is 100% on RA, normal by my interpretation.    COORDINATION OF CARE: 12:41 PM-suspect R shoulder strain with possible rotator cuff injury. Discussed treatment plan which includes Flexeril and Deltasone prescription. Pt has no specific injury and has normal ROM. Advised pt to continue with Ibuprofen and Aleve since she has allergies to most narcotics.  Sling provided for support.    Labs Review Labs Reviewed - No data to display  Imaging Review No results found.   EKG Interpretation None      MDM   Final diagnoses:  Right shoulder pain   BP 146/81 mmHg  Pulse 99  Temp(Src) 99 F (37.2 C) (Oral)  Resp 18  SpO2 100%  I personally performed the services described in this documentation, which was scribed in my presence. The recorded information has been reviewed and is accurate.      Fayrene HelperBowie Marionette Meskill, PA-C 07/10/14 1251  Donnetta HutchingBrian Cook, MD 07/10/14 [phone]

## 2014-07-10 NOTE — Discharge Instructions (Signed)
RICE: Routine Care for Injuries The routine care of many injuries includes Rest, Ice, Compression, and Elevation (RICE). HOME CARE INSTRUCTIONS  Rest is needed to allow your body to heal. Routine activities can usually be resumed when comfortable. Injured tendons and bones can take up to 6 weeks to heal. Tendons are the cord-like structures that attach muscle to bone.  Ice following an injury helps keep the swelling down and reduces pain.  Put ice in a plastic bag.  Place a towel between your skin and the bag.  Leave the ice on for 15-20 minutes, 3-4 times a day, or as directed by your health care provider. Do this while awake, for the first 24 to 48 hours. After that, continue as directed by your caregiver.  Compression helps keep swelling down. It also gives support and helps with discomfort. If an elastic bandage has been applied, it should be removed and reapplied every 3 to 4 hours. It should not be applied tightly, but firmly enough to keep swelling down. Watch fingers or toes for swelling, bluish discoloration, coldness, numbness, or excessive pain. If any of these problems occur, remove the bandage and reapply loosely. Contact your caregiver if these problems continue.  Elevation helps reduce swelling and decreases pain. With extremities, such as the arms, hands, legs, and feet, the injured area should be placed near or above the level of the heart, if possible. SEEK IMMEDIATE MEDICAL CARE IF:  You have persistent pain and swelling.  You develop redness, numbness, or unexpected weakness.  Your symptoms are getting worse rather than improving after several days. These symptoms may indicate that further evaluation or further X-rays are needed. Sometimes, X-rays may not show a small broken bone (fracture) until 1 week or 10 days later. Make a follow-up appointment with your caregiver. Ask when your X-ray results will be ready. Make sure you get your X-ray results. Document Released:  06/09/2000 Document Revised: 03/02/2013 Document Reviewed: 07/27/2010 [employer] Patient Information 2015 [employer]. This information is not intended to replace advice given to you by your health care provider. Make sure you discuss any questions you have with your health care provider.  

## 2014-07-26 ENCOUNTER — Other Ambulatory Visit (HOSPITAL_COMMUNITY): Payer: Self-pay | Admitting: Orthopedic Surgery

## 2014-07-26 DIAGNOSIS — S134XXD Sprain of ligaments of cervical spine, subsequent encounter: Secondary | ICD-10-CM

## 2014-08-04 ENCOUNTER — Ambulatory Visit (HOSPITAL_COMMUNITY): Payer: BLUE CROSS/BLUE SHIELD

## 2014-08-24 ENCOUNTER — Other Ambulatory Visit: Payer: Self-pay

## 2014-08-24 DIAGNOSIS — Z1231 Encounter for screening mammogram for malignant neoplasm of breast: Secondary | ICD-10-CM

## 2014-09-02 ENCOUNTER — Ambulatory Visit: Payer: BLUE CROSS/BLUE SHIELD

## 2014-11-13 ENCOUNTER — Emergency Department (HOSPITAL_COMMUNITY)
Admission: EM | Admit: 2014-11-13 | Discharge: 2014-11-13 | Disposition: A | Discharge status: Home / Self Care | Payer: BLUE CROSS/BLUE SHIELD | Attending: Emergency Medicine | Admitting: Emergency Medicine

## 2014-11-13 ENCOUNTER — Encounter (HOSPITAL_COMMUNITY): Payer: Self-pay | Admitting: Cardiology

## 2014-11-13 ENCOUNTER — Emergency Department (HOSPITAL_COMMUNITY): Payer: BLUE CROSS/BLUE SHIELD

## 2014-11-13 DIAGNOSIS — Z8742 Personal history of other diseases of the female genital tract: Secondary | ICD-10-CM | POA: Insufficient documentation

## 2014-11-13 DIAGNOSIS — Z91040 Latex allergy status: Secondary | ICD-10-CM | POA: Diagnosis not present

## 2014-11-13 DIAGNOSIS — Z79899 Other long term (current) drug therapy: Secondary | ICD-10-CM | POA: Insufficient documentation

## 2014-11-13 DIAGNOSIS — R109 Unspecified abdominal pain: Secondary | ICD-10-CM | POA: Diagnosis present

## 2014-11-13 DIAGNOSIS — G40909 Epilepsy, unspecified, not intractable, without status epilepticus: Secondary | ICD-10-CM | POA: Diagnosis not present

## 2014-11-13 DIAGNOSIS — M797 Fibromyalgia: Secondary | ICD-10-CM | POA: Insufficient documentation

## 2014-11-13 DIAGNOSIS — M62838 Other muscle spasm: Secondary | ICD-10-CM | POA: Insufficient documentation

## 2014-11-13 DIAGNOSIS — M1990 Unspecified osteoarthritis, unspecified site: Secondary | ICD-10-CM | POA: Insufficient documentation

## 2014-11-13 DIAGNOSIS — G43909 Migraine, unspecified, not intractable, without status migrainosus: Secondary | ICD-10-CM | POA: Insufficient documentation

## 2014-11-13 DIAGNOSIS — G8929 Other chronic pain: Secondary | ICD-10-CM | POA: Diagnosis not present

## 2014-11-13 DIAGNOSIS — F419 Anxiety disorder, unspecified: Secondary | ICD-10-CM | POA: Diagnosis not present

## 2014-11-13 LAB — URINALYSIS, ROUTINE W REFLEX MICROSCOPIC
Bilirubin Urine: NEGATIVE
Glucose, UA: NEGATIVE mg/dL
Hgb urine dipstick: NEGATIVE
Ketones, ur: NEGATIVE mg/dL
Leukocytes, UA: NEGATIVE
Nitrite: NEGATIVE
Protein, ur: NEGATIVE mg/dL
Specific Gravity, Urine: 1.01 (ref 1.005–1.030)
Urobilinogen, UA: 0.2 mg/dL (ref 0.0–1.0)
pH: 7 (ref 5.0–8.0)

## 2014-11-13 MED ORDER — DIPHENHYDRAMINE HCL 25 MG PO CAPS
25.0000 mg | ORAL_CAPSULE | ORAL | Status: DC | PRN
  Administered 2014-11-13: 25 mg via ORAL
  Filled 2014-11-13 (×2): qty 1

## 2014-11-13 MED ORDER — AZITHROMYCIN 250 MG PO TABS: 250.0000 mg | ORAL_TABLET | Freq: Every day | ORAL | Status: DC

## 2014-11-13 MED ORDER — IBUPROFEN 800 MG PO TABS: 800.0000 mg | ORAL_TABLET | Freq: Three times a day (TID) | ORAL | Status: DC

## 2014-11-13 MED ORDER — METHOCARBAMOL 500 MG PO TABS: 500.0000 mg | ORAL_TABLET | Freq: Two times a day (BID) | ORAL | Status: DC | PRN

## 2014-11-13 MED ORDER — OXYCODONE-ACETAMINOPHEN 5-325 MG PO TABS
2.0000 | ORAL_TABLET | Freq: Once | ORAL | Status: AC
  Administered 2014-11-13: 2 via ORAL
  Filled 2014-11-13: qty 2

## 2014-11-13 MED ORDER — OXYCODONE-ACETAMINOPHEN 5-325 MG PO TABS: 1.0000 | ORAL_TABLET | ORAL | Status: DC | PRN

## 2014-11-13 NOTE — ED Provider Notes (Addendum)
CSN: [id number]     Arrival date & time 11/13/14  1451 History   First MD Initiated Contact with Patient 11/13/14 1509     No chief complaint on file.    (Consider location/radiation/quality/duration/timing/severity/associated sxs/prior Treatment) HPI Comments: 39 year old female, history of reported endometriosis, panic attacks, migraine headaches who has been taking naproxen twice a day for the last 2 weeks to help deal with intermittent low back pain. She reports that this pain does seem to get worse with certain positions however it is also constant underlying pain that won't go away. She has some associated abdominal discomfort with radiation to the flanks, some urinary symptoms but no burning, no nausea or vomiting, no fevers or chills. She describes her back as bilateral lower back, radiates across the back, it is not central, it is not associated with neurologic complaints  The history is provided by the patient.    Past Medical History  Diagnosis Date  . Arthritis   . Fibromyalgia   . Endometriosis   . Migraine headache     "since 39 yrs old"  . Panic attack   . Anxiety   . Chronic pelvic pain in female 2003  . 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 Chronic neck pain    Past Surgical History  Procedure Laterality Date  . Vaginal hysterectomy    . Appendectomy    . Cholecystectomy     History reviewed. No pertinent family history. Social History  Substance Use Topics  . Smoking status: Never Smoker   . Smokeless tobacco: None  . Alcohol Use: No   OB History    Gravida Para Term Preterm AB TAB SAB Ectopic Multiple Living            2     Review of Systems  All other systems reviewed and are negative.     Allergies  Codeine; Hydrocodone; Latex; and Morphine and related  Home Medications   Prior to Admission medications   Medication Sig Start Date End Date Taking? Authorizing Provider  Ascorbic Acid (VITAMIN C PO) Take 1 tablet by mouth daily.    Yes Historical Provider, MD  Cholecalciferol (VITAMIN D PO) Take 1 tablet by mouth daily.   Yes Historical Provider, MD  Coenzyme Q10 (CO Q 10 PO) Take 1 tablet by mouth daily.   Yes Historical Provider, MD  Magnesium 250 MG TABS Take 500 mg by mouth 2 (two) times daily.   Yes Historical Provider, MD  Multiple Vitamins-Minerals (ANTIOXIDANT PO) Take 1 tablet by mouth daily.   Yes Historical Provider, MD  naproxen sodium (ALEVE) 220 MG tablet Take 220 mg by mouth 2 (two) times daily as needed (FOR PAIN).   Yes Historical Provider, MD  SUMAtriptan (IMITREX) 100 MG tablet Take 50 mg by mouth every 2 (two) hours as needed for migraine or headache. May repeat in 2 hours if headache persists or recurs.   Yes Historical Provider, MD  cyclobenzaprine (FLEXERIL) 10 MG tablet Take 1 tablet (10 mg total) by mouth 3 (three) times daily as needed for muscle spasms. Patient not taking: Reported on 11/13/2014 07/10/14   [name], PA-C  diazepam (VALIUM) 5 MG tablet Take 1 tablet (5 mg total) by mouth 3 (three) times daily. Patient not taking: Reported on 11/13/2014 07/09/14   Ivery Quale, PA-C  dicyclomine (BENTYL) 20 MG tablet Take 1 tablet (20 mg total) by mouth 4 (four) times daily as needed for spasms. Patient not taking: Reported on  07/09/2014 06/07/13   Mancel Bale, MD  ibuprofen (ADVIL,MOTRIN) 800 MG tablet Take 1 tablet (800 mg total) by mouth 3 (three) times daily. 11/13/14   Eber Hong, MD  methocarbamol (ROBAXIN) 500 MG tablet Take 1 tablet (500 mg total) by mouth 2 (two) times daily as needed for muscle spasms. 11/13/14   Eber Hong, MD  oxyCODONE-acetaminophen (PERCOCET) 5-325 MG per tablet Take 1 tablet by mouth every 4 (four) hours as needed. 11/13/14   Eber Hong, MD   BP 136/69 mmHg  Pulse 92  Temp(Src) 97.5 F (36.4 C) (Oral)  Resp 18  Ht 5\' 3"  (1.6 m)  Wt 130 lb (58.968 kg)  BMI 23.03 kg/m2  SpO2 100% Physical Exam  Constitutional: She appears well-developed and well-nourished. No  distress.  HENT:  Head: Normocephalic and atraumatic.  Mouth/Throat: Oropharynx is clear and moist. No oropharyngeal exudate.  Eyes: Conjunctivae and EOM are normal. Pupils are equal, round, and reactive to light. Right eye exhibits no discharge. Left eye exhibits no discharge. No scleral icterus.  Neck: Normal range of motion. Neck supple. No JVD present. No thyromegaly present.  Cardiovascular: Normal rate, regular rhythm, normal heart sounds and intact distal pulses.  Exam reveals no gallop and no friction rub.   No murmur heard. Pulmonary/Chest: Effort normal and breath sounds normal. No respiratory distress. She has no wheezes. She has no rales.  Abdominal: Soft. Bowel sounds are normal. She exhibits no distension and no mass. There is no tenderness.  No abdominal tenderness to palpation. Bilateral CVA tenderness is mild and symmetrical  Musculoskeletal: Normal range of motion. She exhibits tenderness (minimal tenderness over the bilateral flanks, no spinal tenderness). She exhibits no edema.  Lymphadenopathy:    She has no cervical adenopathy.  Neurological: She is alert. Coordination normal.  Normal strength and sensation in the bilateral lower extremities  Skin: Skin is warm and dry. No rash noted. No erythema.  Psychiatric: She has a normal mood and affect. Her behavior is normal.  Nursing note and vitals reviewed.   ED Course  Procedures (including critical care time) Labs Review Labs Reviewed  URINE CULTURE  URINALYSIS, ROUTINE W REFLEX MICROSCOPIC (NOT AT [facility])    Imaging Review Ct Renal Stone Study  11/13/2014   CLINICAL DATA:  Low back pain.  This started at 4 a.m.  EXAM: CT ABDOMEN AND PELVIS WITHOUT CONTRAST  TECHNIQUE: Multidetector CT imaging of the abdomen and pelvis was performed following the standard protocol without IV contrast.  COMPARISON:  06/07/2013  FINDINGS: Lower chest: Tree-in-bud reticulonodular opacities in the posterior basal segment left lower lobe  compatible with atypical infectious bronchiolitis. Minimal posterior pleural nodularity on the right is likely incidental.  Hepatobiliary: Cholecystectomy. CBD measures up to 10 mm, previously 7 mm.  Pancreas: Unremarkable  Spleen: Unremarkable  Adrenals/Urinary Tract: 1-2 mm left kidney lower pole nonobstructive calculus. Stable bilateral pelvic vascular phleboliths, without ureteral calculus identified.  Stomach/Bowel: Appendectomy.  Vascular/Lymphatic: Unremarkable  Reproductive: Uterus absent.  Ovaries normal.  Other: No supplemental non-categorized findings.  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  IMPRESSION: 1. 1-2 mm left kidney lower pole nonobstructive calculus, but no hydronephrosis or hydroureter, or ureteral calculus. 2. Mild atypical infectious bronchiolitis in the posterior basal segment left lower lobe. 3. 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   Electronically Signed   By: Gaylyn Rong M.D.   On: 11/13/2014 17:35   I have personally reviewed and evaluated these images and  lab results as part of my medical decision-making.    MDM   Final diagnoses:  Muscle spasm    Nontender abdomen, back pain, possibly urinary source, but also consider musculoskeletal, doubt pathologic source given normal neurologic exam and exacerbating factor being movement. Vital signs unremarkable.  CT unremarkable - pt given copy of results and encouraged to f/u with Ortho routine / PCP routine and chiropractic / massage for back  =-  Pain likely muscle spasm.  Also given abx for pna on CT - states she has had mild cough X 2 weeeks  Meds given in ED:  Medications  diphenhydrAMINE (BENADRYL) capsule 25 mg (25 mg Oral Given 11/13/14 1654)  oxyCODONE-acetaminophen (PERCOCET/ROXICET) 5-325 MG per tablet 2 tablet (2 tablets Oral Given 11/13/14 1633)    New Prescriptions   IBUPROFEN (ADVIL,MOTRIN) 800 MG TABLET     Take 1 tablet (800 mg total) by mouth 3 (three) times daily.   METHOCARBAMOL (ROBAXIN) 500 MG TABLET    Take 1 tablet (500 mg total) by mouth 2 (two) times daily as needed for muscle spasms.   OXYCODONE-ACETAMINOPHEN (PERCOCET) 5-325 MG PER TABLET    Take 1 tablet by mouth every 4 (four) hours as needed.      Eber Hong, MD 11/13/14 1757  Eber Hong, MD 11/13/14 [phone]

## 2014-11-13 NOTE — ED Notes (Signed)
Lower back pain for while with frequent urination.  Pain worse since 4 am with some nausea.

## 2014-11-13 NOTE — Discharge Instructions (Signed)

## 2014-11-15 LAB — URINE CULTURE: Culture: NO GROWTH

## 2014-11-18 ENCOUNTER — Encounter: Payer: Self-pay | Admitting: Pediatrics

## 2014-11-18 ENCOUNTER — Ambulatory Visit (INDEPENDENT_AMBULATORY_CARE_PROVIDER_SITE_OTHER): Payer: BLUE CROSS/BLUE SHIELD | Admitting: Pediatrics

## 2014-11-18 VITALS — BP 118/73 | HR 69 | Temp 97.4°F | Ht 63.0 in | Wt 132.6 lb

## 2014-11-18 DIAGNOSIS — J309 Allergic rhinitis, unspecified: Secondary | ICD-10-CM

## 2014-11-18 DIAGNOSIS — R631 Polydipsia: Secondary | ICD-10-CM

## 2014-11-18 DIAGNOSIS — N200 Calculus of kidney: Secondary | ICD-10-CM | POA: Diagnosis not present

## 2014-11-18 DIAGNOSIS — M549 Dorsalgia, unspecified: Secondary | ICD-10-CM

## 2014-11-18 DIAGNOSIS — M87051 Idiopathic aseptic necrosis of right femur: Secondary | ICD-10-CM | POA: Diagnosis not present

## 2014-11-18 DIAGNOSIS — J189 Pneumonia, unspecified organism: Secondary | ICD-10-CM

## 2014-11-18 DIAGNOSIS — M87052 Idiopathic aseptic necrosis of left femur: Secondary | ICD-10-CM | POA: Diagnosis not present

## 2014-11-18 LAB — POCT URINALYSIS DIPSTICK
Bilirubin, UA: NEGATIVE
Blood, UA: NEGATIVE
Glucose, UA: NEGATIVE
Ketones, UA: NEGATIVE
Leukocytes, UA: NEGATIVE
Nitrite, UA: NEGATIVE
Protein, UA: NEGATIVE
Spec Grav, UA: 1.015
Urobilinogen, UA: NEGATIVE
pH, UA: 6

## 2014-11-18 LAB — POCT UA - MICROSCOPIC ONLY
Bacteria, U Microscopic: NEGATIVE
Casts, Ur, LPF, POC: NEGATIVE
Crystals, Ur, HPF, POC: NEGATIVE
RBC, urine, microscopic: NEGATIVE
WBC, Ur, HPF, POC: NEGATIVE
Yeast, UA: NEGATIVE

## 2014-11-18 LAB — GLUCOSE, POCT (MANUAL RESULT ENTRY): POC Glucose: 94 mg/dl (ref 70–99)

## 2014-11-18 MED ORDER — TAMSULOSIN HCL 0.4 MG PO CAPS: 0.4000 mg | ORAL_CAPSULE | Freq: Every day | ORAL | Status: DC

## 2014-11-18 NOTE — Patient Instructions (Signed)
Take flomax once a day until pain improves or stone is passed

## 2014-11-18 NOTE — Progress Notes (Signed)
Subjective:    Patient ID: [name], female    DOB: 1975/08/18, 39 y.o.   MRN: [id number]  HPI: [name] is a 39 y.o. female presenting on 11/18/2014 for flank pain, pneumonia follow up, recent hospital visit.  Back to baseline cough. Finished antibiotics from [facility] yesterday. No fevers. She is not sure that her cough was ever worse than her baseline cough that she thinks is due to post-nasal drip.   Back pain started 5 days ago acutely after spending the night in ED with son after he was in a serious car accident, though he is doing ok now. Moving makes pain worse sometimes, somestimes not able to sit still.  Heat made it better. Was having chills and nausea when pain first started, she then presented to ED. Pain was getting worse. Dull ache all the time on L side, sometimes will get so severe she has to stop and do Lamaze breathing. Does better with standing. Lasts for minutes at a time, comes is really painful then goes away. Starting feeling "weird sensation" in her urethra area, normal urination, no dysuria, no frequency or urgency. Sees [facility]. Has not had kidney stones before. 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  On steroids off and on for bronchitis and bronchopneumonia  No constipation. No history of back problems. Does not remember injuring back before.    Relevant past medical, surgical, family and social history reviewed and updated as indicated. Interim medical history since our last visit reviewed. Allergies and medications reviewed and updated.   ROS: All systems negative other than what is in HPI above.  Past Medical History Patient Active Problem List   Diagnosis Date Noted  . Fibrositis 02/28/2014  . Headache, migraine 02/28/2014    Current Outpatient Prescriptions  Medication Sig Dispense Refill  . Ascorbic Acid (VITAMIN C PO) Take 1 tablet by mouth daily.    Marland Kitchen azithromycin  (ZITHROMAX Z-PAK) 250 MG tablet Take 1 tablet (250 mg total) by mouth daily.  PO day 1, then  PO days 205 6 tablet 0  . Cholecalciferol (VITAMIN D PO) Take 1 tablet by mouth daily.    . Coenzyme Q10 (CO Q 10 PO) Take 1 tablet by mouth daily.    . Magnesium 250 MG TABS Take 500 mg by mouth 2 (two) times daily.    . methocarbamol (ROBAXIN) 500 MG tablet Take 1 tablet (500 mg total) by mouth 2 (two) times daily as needed for muscle spasms. 20 tablet 0  . naproxen sodium (ALEVE) 220 MG tablet Take 220 mg by mouth 2 (two) times daily as needed (FOR PAIN).    Marland Kitchen oxyCODONE-acetaminophen (PERCOCET) 5-325 MG per tablet Take 1 tablet by mouth every 4 (four) hours as needed. 20 tablet 0  . SUMAtriptan (IMITREX) 100 MG tablet Take 50 mg by mouth every 2 (two) hours as needed for migraine or headache. May repeat in 2 hours if headache persists or recurs.    . tamsulosin (FLOMAX) 0.4 MG CAPS capsule Take 1 capsule (0.4 mg total) by mouth daily. 14 capsule 0   No current facility-administered medications for this visit.       Objective:    BP 118/73 mmHg  Pulse 69  Temp(Src) 97.4 F (36.3 C) (Oral)  Ht  (1.6 m)  Wt 132 lb 9.6 oz (60.147 kg)  BMI 23.49 kg/m2  Wt Readings from Last 3 Encounters:  11/18/14 132 lb 9.6 oz (60.147 kg)  11/13/14 130 lb (58.968 kg)  07/09/14 132 lb (59.875 kg)     Gen: thin female, in NAD, alert, cooperative with exam, NCAT EYES: EOMI, no scleral injection or icterus ENT:  TMs pearly gray b/l, OP without erythema LYMPH: no cervical LAD CV: NRRR, normal S1/S2, no murmur, DP pulses 2+ b/l Resp: CTABL, no wheezes, normal WOB Abd: +BS, soft, NTND. no guarding or organomegaly Ext: No edema, warm Neuro: Alert and oriented, strength equal b/l UE and LE, coodrination grossly normal MSK: normal muscle bulk, no tenderness over spine. TTP over L paraspinal muscles. Some dull CVA tenderness L side.     Assessment & Plan:   [name] was seen today for flank pain  and follow up recent ED visit where she was treated for a renal stone and pneumonia and found to have avascular necrosis of both hips incidentally on CT scan. Her prior records were reviewed from hospitalization, including lab results. CT scan images were reviewed with patient.  Diagnoses and all orders for this visit:  CVA tenderness History of colicky flank pain with radiation to the groin could be consistent with  passing of small stone that was seen on CT. UA was negative for blood or infection. Muscle relaxers have been helpful with pain. Also possible pain is related to MSK injury and back spasms. Does have point tenderness over paraspinal muscles. Renal stone is 1-2 mm, not likely to be obstructing.  Polydipsia Ongoing problem recently, prior lab results have isolated elevated glucose levels. No family hsitory of diabetes and pt does not have normal risk factors of DM2, no metabolic syndrome, will get BMP.  Renal stone -     tamsulosin (FLOMAX) 0.4 MG CAPS capsule; Take 1 capsule (0.4 mg total) by mouth dail, gave two weeks worth.  Pneumonia, organism unspecified Lung exam normal. No focal pneumonia on CT scan that needs CXR f/u, rather with interstiial bronchopneumonia.   Avascular necrosis of bones of both hips Asymptomatic at this time. Pt with long history of glucocorticoid use for bronchopneumonia per her report that could have put her at risk. No known sickle cell disease, denies heavy EtOH use. Pt plans to follow up with ortho.  Allergic rhinitis  Use flonase daily  Follow up plan: Return if symptoms worsen or fail to improve, for 1-2 weeks if needed.  Rex Kras, MD [facility] 11/18/2014, 1:53 PM

## 2014-11-19 DIAGNOSIS — M87052 Idiopathic aseptic necrosis of left femur: Secondary | ICD-10-CM

## 2014-11-19 DIAGNOSIS — M87051 Idiopathic aseptic necrosis of right femur: Secondary | ICD-10-CM | POA: Insufficient documentation

## 2014-11-19 LAB — BMP8+EGFR
BUN/Creatinine Ratio: 25 — ABNORMAL HIGH (ref 9–23)
BUN: 14 mg/dL (ref 6–24)
CO2: 23 mmol/L (ref 18–29)
Calcium: 9.3 mg/dL (ref 8.7–10.2)
Chloride: 100 mmol/L (ref 97–108)
Creatinine, Ser: 0.57 mg/dL (ref 0.57–1.00)
GFR calc Af Amer: 133 mL/min/{1.73_m2} (ref >59)
GFR calc non Af Amer: 116 mL/min/{1.73_m2} (ref >59)
Glucose: 87 mg/dL (ref 65–99)
Potassium: 4.5 mmol/L (ref 3.5–5.2)
Sodium: 137 mmol/L (ref 134–144)

## 2014-12-16 ENCOUNTER — Telehealth: Payer: Self-pay | Admitting: Pediatrics

## 2014-12-16 DIAGNOSIS — G43809 Other migraine, not intractable, without status migrainosus: Secondary | ICD-10-CM

## 2014-12-16 MED ORDER — SUMATRIPTAN SUCCINATE 100 MG PO TABS: 50.0000 mg | ORAL_TABLET | ORAL | Status: DC | PRN

## 2014-12-16 NOTE — Telephone Encounter (Signed)
Sent Rx to [facility] in [place]

## 2014-12-16 NOTE — Telephone Encounter (Signed)
Pt aware refill sent to [facility] °

## 2015-01-11 ENCOUNTER — Emergency Department (HOSPITAL_COMMUNITY): Payer: BLUE CROSS/BLUE SHIELD

## 2015-01-11 ENCOUNTER — Encounter (HOSPITAL_COMMUNITY): Payer: Self-pay

## 2015-01-11 ENCOUNTER — Emergency Department (HOSPITAL_COMMUNITY)
Admission: EM | Admit: 2015-01-11 | Discharge: 2015-01-11 | Disposition: A | Discharge status: Home / Self Care | Payer: BLUE CROSS/BLUE SHIELD | Attending: Emergency Medicine | Admitting: Emergency Medicine

## 2015-01-11 DIAGNOSIS — Z8742 Personal history of other diseases of the female genital tract: Secondary | ICD-10-CM | POA: Diagnosis not present

## 2015-01-11 DIAGNOSIS — M1990 Unspecified osteoarthritis, unspecified site: Secondary | ICD-10-CM | POA: Insufficient documentation

## 2015-01-11 DIAGNOSIS — M797 Fibromyalgia: Secondary | ICD-10-CM | POA: Diagnosis not present

## 2015-01-11 DIAGNOSIS — R42 Dizziness and giddiness: Secondary | ICD-10-CM | POA: Insufficient documentation

## 2015-01-11 DIAGNOSIS — G43909 Migraine, unspecified, not intractable, without status migrainosus: Secondary | ICD-10-CM | POA: Diagnosis not present

## 2015-01-11 DIAGNOSIS — Z87442 Personal history of urinary calculi: Secondary | ICD-10-CM | POA: Diagnosis not present

## 2015-01-11 DIAGNOSIS — Z91040 Latex allergy status: Secondary | ICD-10-CM | POA: Insufficient documentation

## 2015-01-11 DIAGNOSIS — Z79899 Other long term (current) drug therapy: Secondary | ICD-10-CM | POA: Insufficient documentation

## 2015-01-11 DIAGNOSIS — G8929 Other chronic pain: Secondary | ICD-10-CM | POA: Diagnosis not present

## 2015-01-11 DIAGNOSIS — R5383 Other fatigue: Secondary | ICD-10-CM | POA: Insufficient documentation

## 2015-01-11 DIAGNOSIS — R110 Nausea: Secondary | ICD-10-CM | POA: Insufficient documentation

## 2015-01-11 DIAGNOSIS — R531 Weakness: Secondary | ICD-10-CM | POA: Diagnosis present

## 2015-01-11 DIAGNOSIS — R6883 Chills (without fever): Secondary | ICD-10-CM | POA: Insufficient documentation

## 2015-01-11 DIAGNOSIS — Z8659 Personal history of other mental and behavioral disorders: Secondary | ICD-10-CM | POA: Insufficient documentation

## 2015-01-11 HISTORY — DX: Calculus of kidney: N20.0

## 2015-01-11 LAB — CBC WITH DIFFERENTIAL/PLATELET
Basophils Absolute: 0.1 10*3/uL (ref 0.0–0.1)
Basophils Relative: 1 %
Eosinophils Absolute: 0.1 10*3/uL (ref 0.0–0.7)
Eosinophils Relative: 1 %
HCT: 38.7 % (ref 36.0–46.0)
Hemoglobin: 13 g/dL (ref 12.0–15.0)
Lymphocytes Relative: 20 %
Lymphs Abs: 1.3 10*3/uL (ref 0.7–4.0)
MCH: 30.1 pg (ref 26.0–34.0)
MCHC: 33.6 g/dL (ref 30.0–36.0)
MCV: 89.6 fL (ref 78.0–100.0)
Monocytes Absolute: 0.3 10*3/uL (ref 0.1–1.0)
Monocytes Relative: 5 %
Neutro Abs: 4.7 10*3/uL (ref 1.7–7.7)
Neutrophils Relative %: 73 %
Platelets: 255 10*3/uL (ref 150–400)
RBC: 4.32 MIL/uL (ref 3.87–5.11)
RDW: 12.2 % (ref 11.5–15.5)
WBC: 6.4 10*3/uL (ref 4.0–10.5)

## 2015-01-11 LAB — BASIC METABOLIC PANEL
Anion gap: 5 (ref 5–15)
BUN: 13 mg/dL (ref 6–20)
CO2: 27 mmol/L (ref 22–32)
Calcium: 9 mg/dL (ref 8.9–10.3)
Chloride: 107 mmol/L (ref 101–111)
Creatinine, Ser: 0.68 mg/dL (ref 0.44–1.00)
GFR calc Af Amer: >60 mL/min (ref >60)
GFR calc non Af Amer: >60 mL/min (ref >60)
Glucose, Bld: 108 mg/dL — ABNORMAL HIGH (ref 65–99)
Potassium: 4 mmol/L (ref 3.5–5.1)
Sodium: 139 mmol/L (ref 135–145)

## 2015-01-11 LAB — D-DIMER, QUANTITATIVE: D-Dimer, Quant: <0.27 ug/mL-FEU (ref 0.00–0.48)

## 2015-01-11 LAB — TROPONIN I: Troponin I: <0.03 ng/mL (ref <0.031)

## 2015-01-11 NOTE — ED Notes (Signed)
Assisted pt to and from restroom.  Pt walked without difficulty.

## 2015-01-11 NOTE — Discharge Instructions (Signed)
Your testing has not revealed the answer to your symptoms. U have had normal testing for your heart, normal testing for blood clots, and normal blood work regarding your kidneys, your electrolytes, your blood counts.  Please follow-up with your doctor in 2 days if no better. Take ibuprofen for body aches, drink plenty of fluids and rest for the next 48 hours  Please obtain all of your results from medical records or have your doctors office obtain the results - share them with your doctor - you should be seen at your doctors office in the next 2 days. Call today to arrange your follow up. Take the medications as prescribed. Please review all of the medicines and only take them if you do not have an allergy to them. Please be aware that if you are taking birth control pills, taking other prescriptions, ESPECIALLY ANTIBIOTICS may make the birth control ineffective - if this is the case, either do not engage in sexual activity or use alternative methods of birth control such as condoms until you have finished the medicine and your family doctor says it is OK to restart them. If you are on a blood thinner such as COUMADIN, be aware that any other medicine that you take may cause the coumadin to either work too much, or not enough - you should have your coumadin level rechecked in next 7 days if this is the case.  ?  It is also a possibility that you have an allergic reaction to any of the medicines that you have been prescribed - Everybody reacts differently to medications and while MOST people have no trouble with most medicines, you may have a reaction such as nausea, vomiting, rash, swelling, shortness of breath. If this is the case, please stop taking the medicine immediately and contact your physician.  ?  You should return to the ER if you develop severe or worsening symptoms.

## 2015-01-11 NOTE — ED Notes (Signed)
Pt reports woke up at 2 am with pain in left arm, generalized weakness, and dizziness.  Pt also says feels SOB with any exertion.  Pt says also has intermittent chest pain and nausea

## 2015-01-11 NOTE — ED Notes (Addendum)
Pt reports had pneumonia a few weeks ago.   Reports cough since yesterday.

## 2015-01-11 NOTE — ED Notes (Signed)
MD at bedside. 

## 2015-01-11 NOTE — ED Provider Notes (Signed)
CSN: [id number]     Arrival date & time 01/11/15  1313 History   First MD Initiated Contact with Patient 01/11/15 1325     Chief Complaint  Patient presents with  . Weakness  . Dizziness     (Consider location/radiation/quality/duration/timing/severity/associated sxs/prior Treatment) HPI Comments: The patient is a 39 year old female, she has a history of a prior hysterectomy, also has a history of anxiety, panic attacks, migraine headaches, fibromyalgia. She states that last night she started to feel an abnormal feeling in her chest, she was waken up in the middle the night by a left arm discomfort which she describes as a quick achiness. She has had associated generalized fatigue which is fairly significant for her as well as a feeling of shortness of breath stating that she can't walk across the room without feeling short of breath. She denies swelling of her legs, denies fevers though she states that she woke up feeling chills with her teeth chattering, denies any other risk factors for pulmonary embolism including oral contraceptive therapy, estrogen replacement, trauma, travel, immobilization, surgery, tobacco use. She denies abdominal pain or back pain but does have some nausea that started today and feels as though she might be having diarrhea coming soon. No known sick contacts.  Patient is a 39 y.o. female presenting with weakness and dizziness. The history is provided by the patient and the spouse.  Weakness  Dizziness Associated symptoms: weakness     Past Medical History  Diagnosis Date  . Arthritis   . Fibromyalgia   . Endometriosis   . Migraine headache     "since 39 yrs old"  . Panic attack   . Anxiety   . Chronic pelvic pain in female 2003  . Seizures (HCC)     related to migraine headaches  . Chronic neck pain   . Kidney stone   . Chronic shoulder pain    Past Surgical History  Procedure Laterality Date  . Vaginal hysterectomy    . Appendectomy    .  Cholecystectomy     No family history on file. Social History  Substance Use Topics  . Smoking status: Never Smoker   . Smokeless tobacco: None  . Alcohol Use: No   OB History    Gravida Para Term Preterm AB TAB SAB Ectopic Multiple Living            2     Review of Systems  Neurological: Positive for dizziness and weakness.  All other systems reviewed and are negative.     Allergies  Codeine; Hydrocodone; Latex; and Morphine and related  Home Medications   Prior to Admission medications   Medication Sig Start Date End Date Taking? Authorizing Provider  Ascorbic Acid (VITAMIN C PO) Take 1 tablet by mouth daily.   Yes Historical Provider, MD  Cholecalciferol (VITAMIN D PO) Take 1 tablet by mouth daily.   Yes Historical Provider, MD  Coenzyme Q10 (CO Q 10 PO) Take 1 tablet by mouth daily.   Yes Historical Provider, MD  Magnesium 250 MG TABS Take 500 mg by mouth 2 (two) times daily.   Yes Historical Provider, MD  SUMAtriptan (IMITREX) 100 MG tablet Take 0.5 tablets (50 mg total) by mouth every 2 (two) hours as needed for migraine or headache. May repeat in 2 hours if headache persists. 12/16/14  Yes Johna Sheriff, MD  azithromycin (ZITHROMAX Z-PAK) 250 MG tablet Take 1 tablet (250 mg total) by mouth daily.  PO day 1, then   PO days 205 Patient not taking: Reported on 01/11/2015 11/13/14   Eber HongBrian Tykerria Mccubbins, MD  methocarbamol (ROBAXIN) 500 MG tablet Take 1 tablet (500 mg total) by mouth 2 (two) times daily as needed for muscle spasms. Patient not taking: Reported on 01/11/2015 11/13/14   Eber HongBrian Angeleah Labrake, MD  naproxen sodium (ALEVE) 220 MG tablet Take 220 mg by mouth 2 (two) times daily as needed (FOR PAIN).    Historical Provider, MD  oxyCODONE-acetaminophen (PERCOCET) 5-325 MG per tablet Take 1 tablet by mouth every 4 (four) hours as needed. Patient not taking: Reported on 01/11/2015 11/13/14   Eber HongBrian Margaret Staggs, MD  tamsulosin (FLOMAX) 0.4 MG CAPS capsule Take 1 capsule (0.4 mg total) by  mouth daily. Patient not taking: Reported on 01/11/2015 11/18/14   Johna Sheriffarol L Vincent, MD   BP 112/74 mmHg  Pulse 82  Temp(Src) 97.4 F (36.3 C) (Oral)  Resp 22  Ht 5\' 3"  (1.6 m)  Wt 129 lb (58.514 kg)  BMI 22.86 kg/m2  SpO2 98% Physical Exam  Constitutional: She appears well-developed and well-nourished. No distress.  HENT:  Head: Normocephalic and atraumatic.  Mouth/Throat: Oropharynx is clear and moist. No oropharyngeal exudate.  Eyes: Conjunctivae and EOM are normal. Pupils are equal, round, and reactive to light. Right eye exhibits no discharge. Left eye exhibits no discharge. No scleral icterus.  Neck: Normal range of motion. Neck supple. No JVD present. No thyromegaly present.  Cardiovascular: Regular rhythm, normal heart sounds and intact distal pulses.  Exam reveals no gallop and no friction rub.   No murmur heard. Heart rate varies between 90 and 110  Pulmonary/Chest: Effort normal and breath sounds normal. No respiratory distress. She has no wheezes. She has no rales.  Abdominal: Soft. Bowel sounds are normal. She exhibits no distension and no mass. There is no tenderness.  Musculoskeletal: Normal range of motion. She exhibits no edema or tenderness.  Lymphadenopathy:    She has no cervical adenopathy.  Neurological: She is alert. Coordination normal.  Skin: Skin is warm and dry. No rash noted. No erythema.  Psychiatric: She has a normal mood and affect. Her behavior is normal.  Nursing note and vitals reviewed.   ED Course  Procedures (including critical care time) Labs Review Labs Reviewed  BASIC METABOLIC PANEL - Abnormal; Notable for the following:    Glucose, Bld 108 (*)    All other components within normal limits  TROPONIN I  D-DIMER, QUANTITATIVE (NOT AT [facility])  CBC WITH DIFFERENTIAL/PLATELET    Imaging Review Dg Chest 2 View  01/11/2015  CLINICAL DATA:  39 year old female with chest pain radiating into the left arm starting at 2 a.m. today, with associated  shortness of breath and sensation of heart fluttering. EXAM: CHEST  2 VIEW COMPARISON:  Chest x-ray 06/06/2013. FINDINGS: Lung volumes are normal. No consolidative airspace disease. No pleural effusions. No pneumothorax. No pulmonary nodule or mass noted. Pulmonary vasculature and the cardiomediastinal silhouette are within normal limits. IMPRESSION: No radiographic evidence of acute cardiopulmonary disease. Electronically Signed   By: Trudie Reedaniel  Entrikin M.D.   On: 01/11/2015 14:02   I have personally reviewed and evaluated these images and lab results as part of my medical decision-making.   EKG Interpretation   Date/Time:  Wednesday January 11 2015 13:22:28 EDT Ventricular Rate:  99 PR Interval:  121 QRS Duration: 81 QT Interval:  329 QTC Calculation: 422 R Axis:   60 Text Interpretation:  Sinus rhythm No old tracing to compare Normal ECG  Confirmed by [facility]  MD, Arlys John ([id number]) on 01/11/2015 1:26:31 PM      MDM   Final diagnoses:  Weakness    The patient is very normal lung sounds, her EKG is unremarkable, her exam is unremarkable, there is no peripheral edema, she is low risk for DVT and thus a d-dimer will be ordered to evaluate. Her heart rate is the only criteria that makes me consider pulmonary embolism. She has no risk for cardiac disease, EKG is normal, doubt coronary artery disease. With her feeling of nausea, generalized fatigue and subjective fevers I suspect that this is the beginning of an infectious illness. Shortness of breath will be evaluated with a chest x-ray.  Filed Vitals:   01/11/15 1323 01/11/15 1330 01/11/15 1400 01/11/15 1443  BP: 133/90 136/96 117/81 112/74  Pulse: 82 105 99 82  Temp: 97.4 F (36.3 C)     TempSrc: Oral     Resp: Height:  (1.6 m)     Weight: 129 lb (58.514 kg)     SpO2: 100% 100% 99% 98%   Persistently normal vital signs, normal testing including d-dimer, troponin, chest x-ray. Her EKG is unremarkable, she has had  symptoms that started at 2:00 AM, for over 12 hours she has been having left arm discomfort, she now states it is worse when she moves it, worse when the blood pressure cuff was squeezing on it, better now that the arm has been switched. Doubt cardiac disease, stable for discharge, possible viral syndrome given her generalized fatigue  Eber Hong, MD 01/11/15 1549

## 2015-01-24 ENCOUNTER — Telehealth: Payer: Self-pay | Admitting: *Deleted

## 2015-01-24 NOTE — Telephone Encounter (Signed)
Stp and she states she doesn't need a hospital follow up.

## 2015-04-21 ENCOUNTER — Encounter: Payer: Self-pay | Admitting: Pediatrics

## 2015-04-21 ENCOUNTER — Ambulatory Visit (INDEPENDENT_AMBULATORY_CARE_PROVIDER_SITE_OTHER): Payer: BLUE CROSS/BLUE SHIELD | Admitting: Pediatrics

## 2015-04-21 VITALS — BP 118/83 | HR 84 | Temp 96.9°F | Ht 63.0 in | Wt 134.6 lb

## 2015-04-21 DIAGNOSIS — L309 Dermatitis, unspecified: Secondary | ICD-10-CM

## 2015-04-21 DIAGNOSIS — M26609 Unspecified temporomandibular joint disorder, unspecified side: Secondary | ICD-10-CM

## 2015-04-21 DIAGNOSIS — G43809 Other migraine, not intractable, without status migrainosus: Secondary | ICD-10-CM | POA: Diagnosis not present

## 2015-04-21 DIAGNOSIS — M545 Low back pain, unspecified: Secondary | ICD-10-CM

## 2015-04-21 MED ORDER — PROPRANOLOL HCL 40 MG PO TABS: 40.0000 mg | ORAL_TABLET | Freq: Three times a day (TID) | ORAL | Status: DC

## 2015-04-21 MED ORDER — METHOCARBAMOL 500 MG PO TABS: 500.0000 mg | ORAL_TABLET | Freq: Two times a day (BID) | ORAL | Status: DC | PRN

## 2015-04-21 MED ORDER — HALOBETASOL PROPIONATE 0.05 % EX CREA: TOPICAL_CREAM | Freq: Two times a day (BID) | CUTANEOUS | Status: AC

## 2015-04-21 MED ORDER — SUMATRIPTAN SUCCINATE 100 MG PO TABS: 50.0000 mg | ORAL_TABLET | ORAL | Status: DC | PRN

## 2015-04-21 MED ORDER — KETOROLAC TROMETHAMINE 60 MG/2ML IM SOLN
60.0000 mg | Freq: Once | INTRAMUSCULAR | Status: AC
  Administered 2015-04-21: 60 mg via INTRAMUSCULAR

## 2015-04-21 NOTE — Patient Instructions (Signed)
Start propranolol  twice day for a week Then can take  (1.5 tabs) twice a day for a week Come back in 4 weeks.

## 2015-04-21 NOTE — Progress Notes (Signed)
Subjective:    Patient ID: [name], female    DOB: [birth year], 40 y.o.   MRN: [id number]  CC: Headache and Back Pain   HPI: [name] is a 40 y.o. female presenting for Headache and Back Pain  Migraine: present since Sunday Hurts on L side  Feels liek she hasnt been sleeping well Not remembering things well since HA started Taking imitrex, helps some take  Felt nauseous last night Feels like normal HA but is longer and not going away  Has been on topamax in the past for seizure disorder, did poorly on it  Center of back feels stiff and painful at times   Depression screen [facility] 2/9 04/21/2015 11/18/2014  Decreased Interest 0 0  Down, Depressed, Hopeless 0 0  PHQ - 2 Score 0 0     Relevant past medical, surgical, family and social history reviewed and updated as indicated. Interim medical history since our last visit reviewed. Allergies and medications reviewed and updated.    ROS: Per HPI unless specifically indicated above  History  Smoking status  . Never Smoker   Smokeless tobacco  . Not on file    Past Medical History Patient Active Problem List   Diagnosis Date Noted  . Avascular necrosis of bones of both hips (HCC) 11/19/2014  . Fibrositis 02/28/2014  . Headache, migraine 02/28/2014    Current Outpatient Prescriptions  Medication Sig Dispense Refill  . Ascorbic Acid (VITAMIN C PO) Take 1 tablet by mouth daily.    . Cholecalciferol (VITAMIN D PO) Take 1 tablet by mouth daily.    . Coenzyme Q10 (CO Q 10 PO) Take 1 tablet by mouth daily.    . Magnesium 250 MG TABS Take 500 mg by mouth 2 (two) times daily.    . naproxen sodium (ALEVE) 220 MG tablet Take 220 mg by mouth 2 (two) times daily as needed (FOR PAIN).    . SUMAtriptan (IMITREX) 100 MG tablet Take 0.5 tablets (50 mg total) by mouth every 2 (two) hours as needed for migraine or headache. May repeat in 2 hours if headache persists. 10 tablet 2  . halobetasol (ULTRAVATE) 0.05 % cream  Apply topically 2 (two) times daily. 50 g 0  . methocarbamol (ROBAXIN) 500 MG tablet Take 1 tablet (500 mg total) by mouth 2 (two) times daily as needed for muscle spasms. 20 tablet 0  . propranolol (INDERAL) 40 MG tablet Take 1 tablet (40 mg total) by mouth 3 (three) times daily. 60 tablet 0   No current facility-administered medications for this visit.       Objective:    BP 118/83 mmHg  Pulse 84  Temp(Src) 96.9 F (36.1 C) (Oral)  Ht  (1.6 m)  Wt 134 lb 9.6 oz (61.054 kg)  BMI 23.85 kg/m2  Wt Readings from Last 3 Encounters:  04/21/15 134 lb 9.6 oz (61.054 kg)  01/11/15 129 lb (58.514 kg)  11/18/14 132 lb 9.6 oz (60.147 kg)     Gen: NAD, alert, cooperative with exam, NCAT EYES: EOMI, no scleral injection or icterus ENT:  TMs pearly gray b/l, OP without erythema LYMPH: no cervical LAD CV: NRRR, normal S1/S2, no murmur, distal pulses 2+ b/l Resp: CTABL, no wheezes, normal WOB Abd: +BS, soft, NTND. no guarding or organomegaly, no CVA tenderness Ext: No edema, warm Neuro: Alert and oriented, strength equal b/l UE and LE, coordination grossly normal, CN III-XII  MSK: normal muscle bulk     Assessment &  Plan:    Stormy was seen today for headache and back pain.  Diagnoses and all orders for this visit:  Other type of migraine -     propranolol (INDERAL) 40 MG tablet; Take 1 tablet (40 mg total) by mouth 3 (three) times daily. -     SUMAtriptan (IMITREX) 100 MG tablet; Take 0.5 tablets (50 mg total) by mouth every 2 (two) hours as needed for migraine or headache. May repeat in 2 hours if headache persists.  TMJ (temporomandibular joint syndrome) -     methocarbamol (ROBAXIN) 500 MG tablet; Take 1 tablet (500 mg total) by mouth 2 (two) times daily as needed for muscle spasms.  Right-sided low back pain without sciatica -     POCT UA - Microscopic Only -     POCT urinalysis dipstick  Eczema -     halobetasol (ULTRAVATE) 0.05 % cream; Apply topically 2 (two) times  daily.    Follow up plan: Return in about 4 weeks (around 05/19/2015).  Rex Kras, MD [facility] 04/21/2015, 3:12 PM

## 2015-06-03 ENCOUNTER — Other Ambulatory Visit: Payer: Self-pay | Admitting: Pediatrics

## 2015-07-20 ENCOUNTER — Ambulatory Visit: Payer: BLUE CROSS/BLUE SHIELD | Admitting: Family

## 2015-07-21 ENCOUNTER — Encounter: Payer: Self-pay | Admitting: Pediatrics

## 2015-08-04 ENCOUNTER — Ambulatory Visit
Admission: RE | Admit: 2015-08-04 | Discharge: 2015-08-04 | Disposition: A | Discharge status: Home / Self Care | Payer: BLUE CROSS/BLUE SHIELD | Source: Ambulatory Visit

## 2015-08-04 DIAGNOSIS — Z1231 Encounter for screening mammogram for malignant neoplasm of breast: Secondary | ICD-10-CM

## 2015-08-11 ENCOUNTER — Ambulatory Visit: Payer: BLUE CROSS/BLUE SHIELD

## 2015-10-05 ENCOUNTER — Other Ambulatory Visit: Payer: Self-pay | Admitting: Pediatrics

## 2015-10-05 DIAGNOSIS — G43809 Other migraine, not intractable, without status migrainosus: Secondary | ICD-10-CM

## 2015-11-27 ENCOUNTER — Emergency Department (HOSPITAL_COMMUNITY)
Admission: EM | Admit: 2015-11-27 | Discharge: 2015-11-27 | Disposition: A | Discharge status: Home / Self Care | Payer: BLUE CROSS/BLUE SHIELD | Attending: Emergency Medicine | Admitting: Emergency Medicine

## 2015-11-27 ENCOUNTER — Encounter (HOSPITAL_COMMUNITY): Payer: Self-pay | Admitting: Emergency Medicine

## 2015-11-27 DIAGNOSIS — R109 Unspecified abdominal pain: Secondary | ICD-10-CM | POA: Insufficient documentation

## 2015-11-27 DIAGNOSIS — Z79899 Other long term (current) drug therapy: Secondary | ICD-10-CM | POA: Insufficient documentation

## 2015-11-27 LAB — URINALYSIS, ROUTINE W REFLEX MICROSCOPIC
Bilirubin Urine: NEGATIVE
Glucose, UA: NEGATIVE mg/dL
Hgb urine dipstick: NEGATIVE
Ketones, ur: NEGATIVE mg/dL
Leukocytes, UA: NEGATIVE
Nitrite: NEGATIVE
Protein, ur: NEGATIVE mg/dL
Specific Gravity, Urine: 1.015 (ref 1.005–1.030)
pH: 7 (ref 5.0–8.0)

## 2015-11-27 LAB — CBC
HCT: 40.3 % (ref 36.0–46.0)
Hemoglobin: 13.7 g/dL (ref 12.0–15.0)
MCH: 30.2 pg (ref 26.0–34.0)
MCHC: 34 g/dL (ref 30.0–36.0)
MCV: 89 fL (ref 78.0–100.0)
Platelets: 255 10*3/uL (ref 150–400)
RBC: 4.53 MIL/uL (ref 3.87–5.11)
RDW: 11.9 % (ref 11.5–15.5)
WBC: 6.5 10*3/uL (ref 4.0–10.5)

## 2015-11-27 LAB — COMPREHENSIVE METABOLIC PANEL
ALT: 17 U/L (ref 14–54)
AST: 14 U/L — ABNORMAL LOW (ref 15–41)
Albumin: 4.3 g/dL (ref 3.5–5.0)
Alkaline Phosphatase: 47 U/L (ref 38–126)
Anion gap: 6 (ref 5–15)
BUN: 14 mg/dL (ref 6–20)
CO2: 27 mmol/L (ref 22–32)
Calcium: 9.2 mg/dL (ref 8.9–10.3)
Chloride: 103 mmol/L (ref 101–111)
Creatinine, Ser: 0.64 mg/dL (ref 0.44–1.00)
GFR calc Af Amer: >60 mL/min (ref >60)
GFR calc non Af Amer: >60 mL/min (ref >60)
Glucose, Bld: 99 mg/dL (ref 65–99)
Potassium: 4.2 mmol/L (ref 3.5–5.1)
Sodium: 136 mmol/L (ref 135–145)
Total Bilirubin: 0.6 mg/dL (ref 0.3–1.2)
Total Protein: 7.2 g/dL (ref 6.5–8.1)

## 2015-11-27 LAB — PREGNANCY, URINE: Preg Test, Ur: NEGATIVE

## 2015-11-27 LAB — LIPASE, BLOOD: Lipase: 16 U/L (ref 11–51)

## 2015-11-27 MED ORDER — ONDANSETRON HCL 8 MG PO TABS: 8.0000 mg | ORAL_TABLET | ORAL | 0 refills | Status: DC | PRN

## 2015-11-27 MED ORDER — KETOROLAC TROMETHAMINE 10 MG PO TABS: 10.0000 mg | ORAL_TABLET | Freq: Four times a day (QID) | ORAL | 0 refills | Status: DC | PRN

## 2015-11-27 NOTE — ED Notes (Signed)
Pt has numerous nonspecific complaints including bilateral flank pain radiating into groin with feeling of bladder fullness last night.  Denies urinary difficulty today.  Also states she had several syncopal episodes over the past few months she wants to be evaluated for.  Denies dizziness, recent syncope, or any neuro symptoms currently.

## 2015-11-27 NOTE — Discharge Instructions (Signed)
Tests were all normal. This still could be a kidney stone. Prescription for pain medicine and nausea medicine. Phone number for urologist given.

## 2015-11-27 NOTE — ED Provider Notes (Signed)
[facility] DE[facility] Provider Note   CSN: [id number] Arrival date & time: 11/27/15  1235     History   Chief Complaint Chief Complaint  Patient presents with  . Flank Pain    HPI [name] is a 40 y.o. female.  Right flank pain radiating to right lower quadrant since yesterday with associated urinary frequency. No fever, chills, dysuria, hematuria. Past medical history includes kidney stones. She complains of palpitations approximately one episode daily each  lasting 2-3 seconds. No frank syncope. Severity is mild to moderate. Nothing makes symptoms better or worse      Past Medical History:  Diagnosis Date  . Anxiety   . Arthritis   . Chronic neck pain   . Chronic pelvic pain in female 2003  . Chronic shoulder pain   . Endometriosis   . Fibromyalgia   . Kidney stone   . Migraine headache    "since 40 yrs old"  . Panic attack   . Seizures (HCC)    related to migraine headaches    Patient Active Problem List   Diagnosis Date Noted  . Avascular necrosis of bones of both hips (HCC) 11/19/2014  . Fibrositis 02/28/2014  . Headache, migraine 02/28/2014    Past Surgical History:  Procedure Laterality Date  . APPENDECTOMY    . CHOLECYSTECTOMY    . VAGINAL HYSTERECTOMY      OB History    Gravida Para Term Preterm AB Living             2   SAB TAB Ectopic Multiple Live Births                   Home Medications    Prior to Admission medications   Medication Sig Start Date End Date Taking? Authorizing Provider  Ascorbic Acid (VITAMIN C PO) Take 1 tablet by mouth daily.   Yes Historical Provider, MD  Cholecalciferol (VITAMIN D PO) Take 1 tablet by mouth daily.   Yes Historical Provider, MD  halobetasol (ULTRAVATE) 0.05 % cream Apply topically 2 (two) times daily. 04/21/15  Yes Johna Sheriff, MD  Magnesium 250 MG TABS Take 500 mg by mouth 2 (two) times daily.   Yes Historical Provider, MD  SUMAtriptan (IMITREX) 100 MG tablet TAKE 1/2 TABLET AS NEEDED  FOR MIGRAINE OR HEADACHE MAY REPEAT IN 2 HOURS IF HEADACHE PERSISTS 10/05/15  Yes Johna Sheriff, MD  ketorolac (TORADOL) 10 MG tablet Take 1 tablet (10 mg total) by mouth every 6 (six) hours as needed. 11/27/15   [name], MD  ondansetron (ZOFRAN) 8 MG tablet Take 1 tablet (8 mg total) by mouth every 4 (four) hours as needed. 11/27/15   [name], MD    Family History No family history on file.  Social History Social History  Substance Use Topics  . Smoking status: Never Smoker  . Smokeless tobacco: Never Used  . Alcohol use No     Allergies   Codeine; Hydrocodone; Latex; and Morphine and related   Review of Systems Review of Systems  All other systems reviewed and are negative.    Physical Exam Updated Vital Signs BP 116/78 (BP Location: Left Arm)   Pulse 93   Temp 97.9 F (36.6 C) (Oral)   Resp 17   Ht 5\' 3"  (1.6 m)   Wt 133 lb (60.3 kg)   SpO2 100%   BMI 23.56 kg/m   Physical Exam  Constitutional: She is oriented to person, place, and time. She  appears well-developed and well-nourished.  HENT:  Head: Normocephalic and atraumatic.  Eyes: Conjunctivae are normal.  Neck: Neck supple.  Cardiovascular: Normal rate and regular rhythm.   Pulmonary/Chest: Effort normal and breath sounds normal.  Abdominal: Soft. Bowel sounds are normal.  Genitourinary:  Genitourinary Comments: Questionable right flank tenderness  Musculoskeletal: Normal range of motion.  Neurological: She is alert and oriented to person, place, and time.  Skin: Skin is warm and dry.  Psychiatric: She has a normal mood and affect. Her behavior is normal.  Nursing note and vitals reviewed.    ED Treatments / Results  Labs (all labs ordered are listed, but only abnormal results are displayed) Labs Reviewed  COMPREHENSIVE METABOLIC PANEL - Abnormal; Notable for the following:       Result Value   AST 14 (*)    All other components within normal limits  LIPASE, BLOOD  CBC  URINALYSIS,  ROUTINE W REFLEX MICROSCOPIC (NOT AT [facility])  PREGNANCY, URINE    EKG  EKG Interpretation None       Radiology No results found.  Procedures Procedures (including critical care time)  Medications Ordered in ED Medications - No data to display   Initial Impression / Assessment and Plan / ED Course  I have reviewed the triage vital signs and the nursing notes.  Pertinent labs & imaging results that were available during my care of the patient were reviewed by me and considered in my medical decision making (see chart for details).  Clinical Course    Patient has normal physical exam and labs. No irregularity noted in pulse. We discussed the possibility of a kidney stone despite a normal urinalysis. Discharge medications Toradol 10 mg and Zofran 8 mg. Referral to urology.  Final Clinical Impressions(s) / ED Diagnoses   Final diagnoses:  Right flank pain    New Prescriptions New Prescriptions   KETOROLAC (TORADOL) 10 MG TABLET    Take 1 tablet (10 mg total) by mouth every 6 (six) hours as needed.   ONDANSETRON (ZOFRAN) 8 MG TABLET    Take 1 tablet (8 mg total) by mouth every 4 (four) hours as needed.     Donnetta HutchingBrian Mallory Schaad, MD 11/27/15 [phone]

## 2015-11-27 NOTE — ED Triage Notes (Signed)
Pt c/o bilateral lower back pain that radiates to RT pelvis. Pt reports last night she felt like she could not empty her bladder. Pt hx of kidney stones. Pt also wants to be evaluated for her near syncopal episodes that she has been experiencing over the past few months.

## 2015-11-28 ENCOUNTER — Ambulatory Visit: Payer: BLUE CROSS/BLUE SHIELD | Admitting: Physician Assistant

## 2015-11-28 ENCOUNTER — Encounter (HOSPITAL_COMMUNITY): Payer: Self-pay | Admitting: Emergency Medicine

## 2015-11-28 ENCOUNTER — Emergency Department (HOSPITAL_COMMUNITY): Payer: BLUE CROSS/BLUE SHIELD

## 2015-11-28 ENCOUNTER — Emergency Department (HOSPITAL_COMMUNITY)
Admission: EM | Admit: 2015-11-28 | Discharge: 2015-11-28 | Disposition: A | Discharge status: Home / Self Care | Payer: BLUE CROSS/BLUE SHIELD | Attending: Emergency Medicine | Admitting: Emergency Medicine

## 2015-11-28 DIAGNOSIS — R110 Nausea: Secondary | ICD-10-CM | POA: Diagnosis not present

## 2015-11-28 DIAGNOSIS — R109 Unspecified abdominal pain: Secondary | ICD-10-CM | POA: Diagnosis present

## 2015-11-28 DIAGNOSIS — R1031 Right lower quadrant pain: Secondary | ICD-10-CM | POA: Insufficient documentation

## 2015-11-28 DIAGNOSIS — R1011 Right upper quadrant pain: Secondary | ICD-10-CM | POA: Diagnosis not present

## 2015-11-28 DIAGNOSIS — Z79899 Other long term (current) drug therapy: Secondary | ICD-10-CM | POA: Diagnosis not present

## 2015-11-28 LAB — URINALYSIS, ROUTINE W REFLEX MICROSCOPIC
Bilirubin Urine: NEGATIVE
Glucose, UA: NEGATIVE mg/dL
Hgb urine dipstick: NEGATIVE
Ketones, ur: NEGATIVE mg/dL
Nitrite: NEGATIVE
Protein, ur: NEGATIVE mg/dL
Specific Gravity, Urine: 1.015 (ref 1.005–1.030)
pH: 7.5 (ref 5.0–8.0)

## 2015-11-28 LAB — URINE MICROSCOPIC-ADD ON

## 2015-11-28 MED ORDER — PROCHLORPERAZINE MALEATE 10 MG PO TABS: 10.0000 mg | ORAL_TABLET | Freq: Four times a day (QID) | ORAL | 0 refills | Status: DC | PRN

## 2015-11-28 MED ORDER — SODIUM CHLORIDE 0.9 % IV SOLN
1000.0000 mL | Freq: Once | INTRAVENOUS | Status: AC
  Administered 2015-11-28: 1000 mL via INTRAVENOUS

## 2015-11-28 MED ORDER — OXYCODONE-ACETAMINOPHEN 5-325 MG PO TABS: 1.0000 | ORAL_TABLET | Freq: Four times a day (QID) | ORAL | 0 refills | Status: DC | PRN

## 2015-11-28 MED ORDER — PROCHLORPERAZINE EDISYLATE 5 MG/ML IJ SOLN
5.0000 mg | Freq: Once | INTRAMUSCULAR | Status: AC
  Administered 2015-11-28: 5 mg via INTRAVENOUS
  Filled 2015-11-28: qty 2

## 2015-11-28 MED ORDER — ONDANSETRON HCL 4 MG/2ML IJ SOLN
4.0000 mg | Freq: Once | INTRAMUSCULAR | Status: AC
  Administered 2015-11-28: 4 mg via INTRAVENOUS
  Filled 2015-11-28: qty 2

## 2015-11-28 MED ORDER — GI COCKTAIL ~~LOC~~
30.0000 mL | Freq: Once | ORAL | Status: AC
  Administered 2015-11-28: 30 mL via ORAL
  Filled 2015-11-28: qty 30

## 2015-11-28 MED ORDER — DIPHENHYDRAMINE HCL 50 MG/ML IJ SOLN
12.5000 mg | Freq: Once | INTRAMUSCULAR | Status: AC
  Administered 2015-11-28: 12.5 mg via INTRAVENOUS
  Filled 2015-11-28: qty 1

## 2015-11-28 MED ORDER — HYDROMORPHONE HCL 1 MG/ML IJ SOLN
1.0000 mg | Freq: Once | INTRAMUSCULAR | Status: AC
  Administered 2015-11-28: 1 mg via INTRAVENOUS
  Filled 2015-11-28: qty 1

## 2015-11-28 NOTE — ED Provider Notes (Signed)
[facility] Provider Note   CSN: [id number] Arrival date & time: 11/28/15  [id number]     History   Chief Complaint Chief Complaint  Patient presents with  . Abdominal Pain    HPI [name] L Wiler is a 40 y.o. female.  Patient is a [age] year old female who presents to the emergency department with a complaint of right flank and abdomen pain.  The patient states that she was seen in the emergency department on yesterday September 18 with similar symptoms. She was told that there was a possibility of kidney stones despite a normal urinalysis. She was discharged on home on Toradol and Zofran. The patient returns today stating that she is still having extreme pain and is nauseated. It is of note that the patient has had an appendectomy in the past, as well as a cholecystectomy. Patient states she was unable to take the medication that was prescribed because she can't keep anything down. She felt that she had some chills earlier this morning, no temperature was actually measured. She also feels that the pain may be worse today than it was on yesterday.      Past Medical History:  Diagnosis Date  . Anxiety   . Arthritis   . Chronic neck pain   . Chronic pelvic pain in female 2003  . Chronic shoulder pain   . Endometriosis   . Fibromyalgia   . Kidney stone   . Migraine headache    "since 40 yrs old"  . Panic attack   . Seizures (HCC)    related to migraine headaches    Patient Active Problem List   Diagnosis Date Noted  . Avascular necrosis of bones of both hips (HCC) 11/19/2014  . Fibrositis 02/28/2014  . Headache, migraine 02/28/2014    Past Surgical History:  Procedure Laterality Date  . APPENDECTOMY    . CHOLECYSTECTOMY    . VAGINAL HYSTERECTOMY      OB History    Gravida Para Term Preterm AB Living             2   SAB TAB Ectopic Multiple Live Births                   Home Medications    Prior to Admission medications   Medication Sig Start Date End  Date Taking? Authorizing Provider  Ascorbic Acid (VITAMIN C PO) Take 1 tablet by mouth daily.    Historical Provider, MD  Cholecalciferol (VITAMIN D PO) Take 1 tablet by mouth daily.    Historical Provider, MD  halobetasol (ULTRAVATE) 0.05 % cream Apply topically 2 (two) times daily. 04/21/15   Johna Sheriffarol L Vincent, MD  ketorolac (TORADOL) 10 MG tablet Take 1 tablet (10 mg total) by mouth every 6 (six) hours as needed. 11/27/15   Donnetta HutchingBrian Cook, MD  Magnesium 250 MG TABS Take 500 mg by mouth 2 (two) times daily.    Historical Provider, MD  ondansetron (ZOFRAN) 8 MG tablet Take 1 tablet (8 mg total) by mouth every 4 (four) hours as needed. 11/27/15   Donnetta HutchingBrian Cook, MD  SUMAtriptan (IMITREX) 100 MG tablet TAKE 1/2 TABLET AS NEEDED FOR MIGRAINE OR HEADACHE MAY REPEAT IN 2 HOURS IF HEADACHE PERSISTS 10/05/15   Johna Sheriffarol L Vincent, MD    Family History History reviewed. No pertinent family history.  Social History Social History  Substance Use Topics  . Smoking status: Never Smoker  . Smokeless tobacco: Never Used  . Alcohol use No  Allergies   Codeine; Hydrocodone; Latex; and Morphine and related   Review of Systems Review of Systems  Constitutional: Negative for fever.  Gastrointestinal: Positive for abdominal pain and nausea.  Genitourinary: Positive for flank pain.  All other systems reviewed and are negative.    Physical Exam Updated Vital Signs BP 123/84 (BP Location: Left Arm)   Pulse 95   Temp 97.7 F (36.5 C) (Oral)   Resp 18   SpO2 100%   Physical Exam  Constitutional: She is oriented to person, place, and time. She appears well-developed and well-nourished.  Non-toxic appearance.  HENT:  Head: Normocephalic.  Right Ear: Tympanic membrane and external ear normal.  Left Ear: Tympanic membrane and external ear normal.  Eyes: EOM and lids are normal. Pupils are equal, round, and reactive to light.  Neck: Normal range of motion. Neck supple. Carotid bruit is not present.    Cardiovascular: Normal rate, regular rhythm, normal heart sounds, intact distal pulses and normal pulses.   Pulmonary/Chest: Breath sounds normal. No respiratory distress.  Abdominal: Soft. Bowel sounds are normal. There is no tenderness. There is no guarding.  No CVA tenderness. Pain in the right upper and lower abdomen. No distention, no ascites, no pulsating mass.  Musculoskeletal: Normal range of motion.  Lymphadenopathy:       Head (right side): No submandibular adenopathy present.       Head (left side): No submandibular adenopathy present.    She has no cervical adenopathy.  Neurological: She is alert and oriented to person, place, and time. She has normal strength. No cranial nerve deficit or sensory deficit.  Skin: Skin is warm and dry.  Psychiatric: She has a normal mood and affect. Her speech is normal.  Nursing note and vitals reviewed.    ED Treatments / Results  Labs (all labs ordered are listed, but only abnormal results are displayed) Labs Reviewed  URINALYSIS, ROUTINE W REFLEX MICROSCOPIC (NOT AT [facility])    EKG  EKG Interpretation None       Radiology No results found.  Procedures Procedures (including critical care time)  Medications Ordered in ED Medications - No data to display   Initial Impression / Assessment and Plan / ED Course  I have reviewed the triage vital signs and the nursing notes.  Pertinent labs & imaging results that were available during my care of the patient were reviewed by me and considered in my medical decision making (see chart for details).  Clinical Course    *I have reviewed nursing notes, vital signs, and all appropriate lab and imaging results for this patient.**  Final Clinical Impressions(s) / ED Diagnoses  Vital signs within normal limits. Patient states she is in extreme pain. Intravenous Dilaudid and Zofran were given to the patient. Patient was also given intravenous Benadryl, as she states she has side effects  to several medications. Patient states she feels very dry, and possibly dehydrated. Patient given IV fluids. Case discussed with Dr.Cardama. Renal ultrasound ordered, will evaluate for hydronephrosis, cyst, or mass. Shortly after receiving the medication, the patient states that she felt she had heartburn, and was having difficulty with her breathing. The patient was treated with Compazine and GI cocktail. The patient states that the GI cocktail helped for a few minutes, but felt as though the heartburn was returning.  Recheck the patient is feeling some better, but states she still has some symptoms. Patient was amateur to the bathroom without problem.   Urinalysis is negative for evidence of  kidney stone. Urinalysis is sent to the lab for culture.  Chest x-ray is negative for acute problem. Renal ultrasound was negative for hydronephrosis, mass, or cyst.  Pt to follow up with  PCp to complete the work up. Rx for antiemetic and narcotic pain medication given For 3 days.   Final diagnoses:  Rt flank pain    New Prescriptions New Prescriptions   No medications on file     Ivery Quale, Cordelia Poche 11/28/15 1657    Nira Conn, MD 11/30/15 1048

## 2015-11-28 NOTE — ED Triage Notes (Signed)
Pt states that she feels like she has to push to pee.

## 2015-11-28 NOTE — Discharge Instructions (Signed)
Your urine tests is negative for blood, or signs of kidney stone. A sample of urine his been sent to the lab for culture to rule out hidden infection. The chest x-ray is read as negative. A renal area ultrasound was also negative. Please see Dr. Oswaldo DoneVincent or a member of her team to complete your workup as soon as possible..Use Percocet for pain not improved by toradol. Use compazine for nausea. Percocet and compazine may cause drowsiness,use these medications with caution.

## 2015-11-28 NOTE — ED Notes (Signed)
Darl PikesSusan took pt to U/[facility]

## 2015-11-28 NOTE — ED Notes (Signed)
PA at the bedside to reassess pt not feeling well, coughing

## 2015-11-28 NOTE — ED Triage Notes (Signed)
Pt came here yesterday and was dx with kidney stones. Pt states that she is still in extreme pain and nauseated. Pt is unable to take medication that was prescribed because she can't keep anything down. Pt states that she has had chills this morning. Pt states that her pain is worse today. Pt states she thinks she is going to pass out.

## 2015-11-29 ENCOUNTER — Inpatient Hospital Stay (HOSPITAL_COMMUNITY): Payer: BLUE CROSS/BLUE SHIELD

## 2015-11-29 ENCOUNTER — Encounter (HOSPITAL_COMMUNITY): Payer: Self-pay | Admitting: *Deleted

## 2015-11-29 ENCOUNTER — Inpatient Hospital Stay (HOSPITAL_COMMUNITY)
Admission: AD | Admit: 2015-11-29 | Discharge: 2015-11-29 | Disposition: A | Discharge status: Home / Self Care | Payer: BLUE CROSS/BLUE SHIELD | Source: Ambulatory Visit | Attending: Obstetrics and Gynecology | Admitting: Obstetrics and Gynecology

## 2015-11-29 DIAGNOSIS — B3731 Acute candidiasis of vulva and vagina: Secondary | ICD-10-CM

## 2015-11-29 DIAGNOSIS — B373 Candidiasis of vulva and vagina: Secondary | ICD-10-CM | POA: Insufficient documentation

## 2015-11-29 DIAGNOSIS — N83202 Unspecified ovarian cyst, left side: Secondary | ICD-10-CM | POA: Diagnosis not present

## 2015-11-29 DIAGNOSIS — R109 Unspecified abdominal pain: Secondary | ICD-10-CM

## 2015-11-29 DIAGNOSIS — R1031 Right lower quadrant pain: Secondary | ICD-10-CM | POA: Diagnosis not present

## 2015-11-29 DIAGNOSIS — R102 Pelvic and perineal pain: Secondary | ICD-10-CM | POA: Insufficient documentation

## 2015-11-29 LAB — CBC WITH DIFFERENTIAL/PLATELET
Basophils Absolute: 0 10*3/uL (ref 0.0–0.1)
Basophils Relative: 0 %
Eosinophils Absolute: 0 10*3/uL (ref 0.0–0.7)
Eosinophils Relative: 0 %
HCT: 37.9 % (ref 36.0–46.0)
Hemoglobin: 13.4 g/dL (ref 12.0–15.0)
Lymphocytes Relative: 13 %
Lymphs Abs: 1.6 10*3/uL (ref 0.7–4.0)
MCH: 30.5 pg (ref 26.0–34.0)
MCHC: 35.4 g/dL (ref 30.0–36.0)
MCV: 86.1 fL (ref 78.0–100.0)
Monocytes Absolute: 0.4 10*3/uL (ref 0.1–1.0)
Monocytes Relative: 3 %
Neutro Abs: 10.4 10*3/uL — ABNORMAL HIGH (ref 1.7–7.7)
Neutrophils Relative %: 84 %
Platelets: 262 10*3/uL (ref 150–400)
RBC: 4.4 MIL/uL (ref 3.87–5.11)
RDW: 12.3 % (ref 11.5–15.5)
WBC: 12.4 10*3/uL — ABNORMAL HIGH (ref 4.0–10.5)

## 2015-11-29 LAB — URINALYSIS, ROUTINE W REFLEX MICROSCOPIC
Bilirubin Urine: NEGATIVE
Glucose, UA: NEGATIVE mg/dL
Hgb urine dipstick: NEGATIVE
Ketones, ur: NEGATIVE mg/dL
Nitrite: NEGATIVE
Protein, ur: NEGATIVE mg/dL
Specific Gravity, Urine: 1.02 (ref 1.005–1.030)
pH: 7 (ref 5.0–8.0)

## 2015-11-29 LAB — WET PREP, GENITAL
Clue Cells Wet Prep HPF POC: NONE SEEN
Sperm: NONE SEEN
Trich, Wet Prep: NONE SEEN

## 2015-11-29 LAB — URINE MICROSCOPIC-ADD ON: RBC / HPF: NONE SEEN RBC/hpf (ref 0–5)

## 2015-11-29 MED ORDER — KETOROLAC TROMETHAMINE 60 MG/2ML IM SOLN
60.0000 mg | Freq: Once | INTRAMUSCULAR | Status: AC
  Administered 2015-11-29: 60 mg via INTRAMUSCULAR
  Filled 2015-11-29: qty 2

## 2015-11-29 MED ORDER — FLUCONAZOLE 150 MG PO TABS: 150.0000 mg | ORAL_TABLET | Freq: Every day | ORAL | 0 refills | Status: DC

## 2015-11-29 NOTE — MAU Provider Note (Signed)
History     CSN: [id number]  Arrival date and time: 11/29/15 9562   First Provider Initiated Contact with Patient 11/29/15 [phone]      Chief Complaint  Patient presents with  . Pelvic Pain   HPI   Ms.[name] is a 40 y.o. female No obstetric history on file. Here with pelvic pain; history of chronic pelvic pain, appendectomy, hysterectomy, and cholecystectomy. She was seen at [facility] yesterday for the same complaint; they did an [place] which did not show a kidney stone. She has not called her GYN to inform them of this issue. She is here today with continued pain. The pain is located in her right lower quadrant and radiates to her right back at times.   She was seen on 9/18 for right flank pain and right lower quadrant pain. She was given medication for pain and nausea. She has been taking toradol which has helped some.  She was seen again yesterday in the ED for the same symptoms. She was given narcotic pain medication however, has not filled the RX for pain medication. She has continued to have right lower quadrant pain, the flank pain has subsided.   OB History    Gravida Para Term Preterm AB Living             2   SAB TAB Ectopic Multiple Live Births                  Past Medical History:  Diagnosis Date  . Anxiety   . Arthritis   . Chronic neck pain   . Chronic pelvic pain in female 2003  . Chronic shoulder pain   . Endometriosis   . Fibromyalgia   . Kidney stone   . Migraine headache    "since 40 yrs old"  . Panic attack   . Seizures (HCC)    related to migraine headaches    Past Surgical History:  Procedure Laterality Date  . APPENDECTOMY    . CHOLECYSTECTOMY    . VAGINAL HYSTERECTOMY      History reviewed. No pertinent family history.  Social History  Substance Use Topics  . Smoking status: Never Smoker  . Smokeless tobacco: Never Used  . Alcohol use No    Allergies:  Allergies  Allergen Reactions  . Codeine Itching  . Hydrocodone  Itching  . Latex Itching  . Morphine And Related Itching    Prescriptions Prior to Admission  Medication Sig Dispense Refill Last Dose  . Ascorbic Acid (VITAMIN C PO) Take 1 tablet by mouth daily.   Past Week at Unknown time  . Cholecalciferol (VITAMIN D PO) Take 1 tablet by mouth daily.   Past Week at Unknown time  . halobetasol (ULTRAVATE) 0.05 % cream Apply topically 2 (two) times daily. (Patient taking differently: Apply 1 application topically 2 (two) times daily as needed (itching). ) 50 g 0 Past Month at Unknown time  . ketorolac (TORADOL) 10 MG tablet Take 1 tablet (10 mg total) by mouth every 6 (six) hours as needed. (Patient taking differently: Take 10 mg by mouth every 6 (six) hours as needed for moderate pain. ) 15 tablet 0 Past Week at Unknown time  . Magnesium 250 MG TABS Take 500 mg by mouth 2 (two) times daily.   Past Week at Unknown time  . ondansetron (ZOFRAN) 8 MG tablet Take 1 tablet (8 mg total) by mouth every 4 (four) hours as needed. (Patient taking differently: Take 8  mg by mouth every 4 (four) hours as needed for nausea or vomiting. ) 8 tablet 0 Past Week at Unknown time  . SUMAtriptan (IMITREX) 100 MG tablet TAKE 1/2 TABLET AS NEEDED FOR MIGRAINE OR HEADACHE MAY REPEAT IN 2 HOURS IF HEADACHE PERSISTS 10 tablet 1 11/28/2015 at Unknown time  . oxyCODONE-acetaminophen (PERCOCET/ROXICET) 5-325 MG tablet Take 1 tablet by mouth every 6 (six) hours as needed. (Patient not taking: Reported on 11/29/2015) 12 tablet 0 Not Taking at Unknown time  . prochlorperazine (COMPAZINE) 10 MG tablet Take 1 tablet (10 mg total) by mouth every 6 (six) hours as needed for nausea or vomiting. (Patient not taking: Reported on 11/29/2015) 12 tablet 0 Not Taking at Unknown time   Results for orders placed or performed during the hospital encounter of 11/29/15 (from the past 24 hour(s))  Urinalysis, Routine w reflex microscopic (not [facility])     Status: Abnormal   Collection Time: 11/29/15  8:45 AM   Result Value Ref Range   Color, Urine YELLOW YELLOW   APPearance CLEAR CLEAR   Specific Gravity, Urine 1.020 1.005 - 1.030   pH 7.0 5.0 - 8.0   Glucose, UA NEGATIVE NEGATIVE mg/dL   Hgb urine dipstick NEGATIVE NEGATIVE   Bilirubin Urine NEGATIVE NEGATIVE   Ketones, ur NEGATIVE NEGATIVE mg/dL   Protein, ur NEGATIVE NEGATIVE mg/dL   Nitrite NEGATIVE NEGATIVE   Leukocytes, UA TRACE (A) NEGATIVE  Urine microscopic-add on     Status: Abnormal   Collection Time: 11/29/15  8:45 AM  Result Value Ref Range   Squamous Epithelial / LPF 0-5 (A) NONE SEEN   WBC, UA 0-5 0 - 5 WBC/hpf   RBC / HPF NONE SEEN 0 - 5 RBC/hpf   Bacteria, UA RARE (A) NONE SEEN   Urine-Other MUCOUS PRESENT   Wet prep, genital     Status: Abnormal   Collection Time: 11/29/15 10:03 AM  Result Value Ref Range   Yeast Wet Prep HPF POC PRESENT (A) NONE SEEN   Trich, Wet Prep NONE SEEN NONE SEEN   Clue Cells Wet Prep HPF POC NONE SEEN NONE SEEN   WBC, Wet Prep HPF POC MANY (A) NONE SEEN   Sperm NONE SEEN   CBC with Differential     Status: Abnormal   Collection Time: 11/29/15 10:12 AM  Result Value Ref Range   WBC 12.4 (H) 4.0 - 10.5 K/uL   RBC 4.40 3.87 - 5.11 MIL/uL   Hemoglobin 13.4 12.0 - 15.0 g/dL   HCT 40.937.9 81.136.0 - 91.446.0 %   MCV 86.1 78.0 - 100.0 fL   MCH 30.5 26.0 - 34.0 pg   MCHC 35.4 30.0 - 36.0 g/dL   RDW 78.212.3 95.611.5 - 21.315.5 %   Platelets 262 150 - 400 K/uL   Neutrophils Relative % 84 %   Neutro Abs 10.4 (H) 1.7 - 7.7 K/uL   Lymphocytes Relative 13 %   Lymphs Abs 1.6 0.7 - 4.0 K/uL   Monocytes Relative 3 %   Monocytes Absolute 0.4 0.1 - 1.0 K/uL   Eosinophils Relative 0 %   Eosinophils Absolute 0.0 0.0 - 0.7 K/uL   Basophils Relative 0 %   Basophils Absolute 0.0 0.0 - 0.1 K/uL  LAB REPORT - SCANNED     Status: None   Collection Time: 11/29/15 12:06 PM   Narrative   Ordered by an unspecified provider.    [place] Transvaginal Non-ob  Result Date: 11/29/2015 CLINICAL DATA:  40 year old female inpatient  with severe abdominal pain for 2 weeks. Prior hysterectomy. EXAM: TRANSABDOMINAL AND TRANSVAGINAL ULTRASOUND OF PELVIS DOPPLER ULTRASOUND OF OVARIES TECHNIQUE: Both transabdominal and transvaginal ultrasound examinations of the pelvis were performed. Transabdominal technique was performed for global imaging of the pelvis including vaginal cuff, ovaries, adnexal regions, and pelvic cul-de-sac. It was necessary to proceed with endovaginal exam following the transabdominal exam to visualize the vaginal cuff and adnexa. Color and duplex Doppler ultrasound was utilized to evaluate blood flow to the ovaries. COMPARISON:  Unenhanced CT abdomen/ pelvis from 11/13/2014. FINDINGS: Uterus Status post hysterectomy. No mass or fluid collection demonstrated at the vaginal cuff. Right ovary Measurements: 4.0 x 2.1 x 2.0 cm. Normal appearance/no adnexal mass. Left ovary Measurements: 2.7 x 2.2 x 1.8 cm. Simple 1.7 cm left ovarian cyst. No suspicious left ovarian or left adnexal masses. Pulsed Doppler evaluation of both ovaries demonstrates normal low-resistance arterial and venous waveforms. Other findings No abnormal free fluid. IMPRESSION: 1. No evidence of adnexal torsion. 2. Simple 1.7 cm left ovarian cyst, for which no further follow-up is required. No suspicious ovarian or adnexal masses. This recommendation follows the consensus statement: Management of Asymptomatic Ovarian and Other Adnexal Cysts Imaged at [place]: Society of Radiologists in Ultrasound Consensus Conference Statement. Radiology 2010; [phone]. 3. Status post hysterectomy. No abnormal findings at the vaginal cuff. Electronically Signed   By: Delbert Phenix M.D.   On: 11/29/2015 13:03   US Pelvis Complete  Result Date: 11/29/2015 CLINICAL DATA:  40 year old female inpatient with severe abdominal pain for 2 weeks. Prior hysterectomy. EXAM: TRANSABDOMINAL AND TRANSVAGINAL ULTRASOUND OF PELVIS DOPPLER ULTRASOUND OF OVARIES TECHNIQUE: Both transabdominal and  transvaginal ultrasound examinations of the pelvis were performed. Transabdominal technique was performed for global imaging of the pelvis including vaginal cuff, ovaries, adnexal regions, and pelvic cul-de-sac. It was necessary to proceed with endovaginal exam following the transabdominal exam to visualize the vaginal cuff and adnexa. Color and duplex Doppler ultrasound was utilized to evaluate blood flow to the ovaries. COMPARISON:  Unenhanced CT abdomen/ pelvis from 11/13/2014. FINDINGS: Uterus Status post hysterectomy. No mass or fluid collection demonstrated at the vaginal cuff. Right ovary Measurements: 4.0 x 2.1 x 2.0 cm. Normal appearance/no adnexal mass. Left ovary Measurements: 2.7 x 2.2 x 1.8 cm. Simple 1.7 cm left ovarian cyst. No suspicious left ovarian or left adnexal masses. Pulsed Doppler evaluation of both ovaries demonstrates normal low-resistance arterial and venous waveforms. Other findings No abnormal free fluid. IMPRESSION: 1. No evidence of adnexal torsion. 2. Simple 1.7 cm left ovarian cyst, for which no further follow-up is required. No suspicious ovarian or adnexal masses. This recommendation follows the consensus statement: Management of Asymptomatic Ovarian and Other Adnexal Cysts Imaged at [place]: Society of Radiologists in Ultrasound Consensus Conference Statement. Radiology 2010; [phone]. 3. Status post hysterectomy. No abnormal findings at the vaginal cuff. Electronically Signed   By: Delbert Phenix M.D.   On: 11/29/2015 13:03   [place] Art/ven Flow Abd Pelv Doppler  Result Date: 11/29/2015 CLINICAL DATA:  40 year old female inpatient with severe abdominal pain for 2 weeks. Prior hysterectomy. EXAM: TRANSABDOMINAL AND TRANSVAGINAL ULTRASOUND OF PELVIS DOPPLER ULTRASOUND OF OVARIES TECHNIQUE: Both transabdominal and transvaginal ultrasound examinations of the pelvis were performed. Transabdominal technique was performed for global imaging of the pelvis including vaginal cuff, ovaries,  adnexal regions, and pelvic cul-de-sac. It was necessary to proceed with endovaginal exam following the transabdominal exam to visualize the vaginal cuff and adnexa. Color and duplex Doppler ultrasound was utilized to evaluate blood flow  to the ovaries. COMPARISON:  Unenhanced CT abdomen/ pelvis from 11/13/2014. FINDINGS: Uterus Status post hysterectomy. No mass or fluid collection demonstrated at the vaginal cuff. Right ovary Measurements: 4.0 x 2.1 x 2.0 cm. Normal appearance/no adnexal mass. Left ovary Measurements: 2.7 x 2.2 x 1.8 cm. Simple 1.7 cm left ovarian cyst. No suspicious left ovarian or left adnexal masses. Pulsed Doppler evaluation of both ovaries demonstrates normal low-resistance arterial and venous waveforms. Other findings No abnormal free fluid. IMPRESSION: 1. No evidence of adnexal torsion. 2. Simple 1.7 cm left ovarian cyst, for which no further follow-up is required. No suspicious ovarian or adnexal masses. This recommendation follows the consensus statement: Management of Asymptomatic Ovarian and Other Adnexal Cysts Imaged at [place]: Society of Radiologists in Ultrasound Consensus Conference Statement. Radiology 2010; [phone]. 3. Status post hysterectomy. No abnormal findings at the vaginal cuff. Electronically Signed   By: Delbert Phenix M.D.   On: 11/29/2015 13:03    Review of Systems  Constitutional: Negative for chills and fever.  Gastrointestinal: Positive for abdominal pain and nausea.  Genitourinary: Negative for dysuria.   Physical Exam   Blood pressure 135/94, pulse 91, temperature 98 F (36.7 C), temperature source Oral, resp. rate 20.  Physical Exam  Constitutional: She is oriented to person, place, and time. She appears well-developed and well-nourished. No distress.  HENT:  Head: Normocephalic.  Eyes: Pupils are equal, round, and reactive to light.  Neck: Neck supple.  GI: Normal appearance. There is tenderness in the right lower quadrant, suprapubic area and  left lower quadrant. There is no rigidity, no rebound and no guarding.  Genitourinary:  Genitourinary Comments: Vagina - Small amount of thick- white, clumpy vaginal discharge, no odor  Cervix - surgically absent  Bimanual exam: Uterus : surgically absent. Tenderness in suprapubic area. Adnexa non tender, no masses bilaterally GC/Chlam, wet prep done Chaperone present for exam.   Musculoskeletal: Normal range of motion.  Neurological: She is alert and oriented to person, place, and time.  Skin: Skin is warm. She is not diaphoretic.  Psychiatric: Her behavior is normal.    MAU Course  Procedures  None  MDM  Discussed Labs and HPI with Dr. Normand Sloop will send for pelvic US.  Pain down significantly with Toradol.   Assessment and Plan   A:  1. Left ovarian cyst   2. Abdominal pain   3. Yeast vaginitis     P:  Discharge home in stable condition Rx: Diflucan Call Dr. Stefano Gaul and schedule a follow up appointment Return to MAU If symptoms worsen, for emergencies.  Ok to take miralax and metamucil for constipation as needed, as directed on the bottle.   Duane Lope, NP 11/29/2015 3:00 PM

## 2015-11-29 NOTE — MAU Note (Addendum)
States lower back started hurting about 2 weeks ago, then her R side. Then started hurting in lower abdomen and pelvis. States she has been seen at [facility] twice. Had U/S on kidneys, bladder and urethra with normal results. States she is still hurting really bad. Wants to know what is wrong. States she has had a hysterectomy, but still has ovaries. States now she has constipation, she thinks from pain med. Sometimes feel like she is going to pass out. States she did sleep last night really well, but woke up with pain still there. Hasn't really had anything to eat and drink for about 2 days. Hurts to have a BM.

## 2015-11-29 NOTE — Discharge Instructions (Signed)
Ovarian Cyst An ovarian cyst is a fluid-filled sac that forms on an ovary. The ovaries are small organs that produce eggs in women. Various types of cysts can form on the ovaries. Most are not cancerous. Many do not cause problems, and they often go away on their own. Some may cause symptoms and require treatment. Common types of ovarian cysts include:  Functional cysts--These cysts may occur every month during the menstrual cycle. This is normal. The cysts usually go away with the next menstrual cycle if the woman does not get pregnant. Usually, there are no symptoms with a functional cyst.  Endometrioma cysts--These cysts form from the tissue that lines the uterus. They are also called "chocolate cysts" because they become filled with blood that turns brown. This type of cyst can cause pain in the lower abdomen during intercourse and with your menstrual period.  Cystadenoma cysts--This type develops from the cells on the outside of the ovary. These cysts can get very big and cause lower abdomen pain and pain with intercourse. This type of cyst can twist on itself, cut off its blood supply, and cause severe pain. It can also easily rupture and cause a lot of pain.  Dermoid cysts--This type of cyst is sometimes found in both ovaries. These cysts may contain different kinds of body tissue, such as skin, teeth, hair, or cartilage. They usually do not cause symptoms unless they get very big.  Theca lutein cysts--These cysts occur when too much of a certain hormone (human chorionic gonadotropin) is produced and overstimulates the ovaries to produce an egg. This is most common after procedures used to assist with the conception of a baby (in vitro fertilization). CAUSES   Fertility drugs can cause a condition in which multiple large cysts are formed on the ovaries. This is called ovarian hyperstimulation syndrome.  A condition called polycystic ovary syndrome can cause hormonal imbalances that can lead to  nonfunctional ovarian cysts. SIGNS AND SYMPTOMS  Many ovarian cysts do not cause symptoms. If symptoms are present, they may include:  Pelvic pain or pressure.  Pain in the lower abdomen.  Pain during sexual intercourse.  Increasing girth (swelling) of the abdomen.  Abnormal menstrual periods.  Increasing pain with menstrual periods.  Stopping having menstrual periods without being pregnant. DIAGNOSIS  These cysts are commonly found during a routine or annual pelvic exam. Tests may be ordered to find out more about the cyst. These tests may include:  Ultrasound.  X-ray of the pelvis.  CT scan.  MRI.  Blood tests. TREATMENT  Many ovarian cysts go away on their own without treatment. Your health care provider may want to check your cyst regularly for 2-3 months to see if it changes. For women in menopause, it is particularly important to monitor a cyst closely because of the higher rate of ovarian cancer in menopausal women. When treatment is needed, it may include any of the following:  A procedure to drain the cyst (aspiration). This may be done using a long needle and ultrasound. It can also be done through a laparoscopic procedure. This involves using a thin, lighted tube with a tiny camera on the end (laparoscope) inserted through a small incision.  Surgery to remove the whole cyst. This may be done using laparoscopic surgery or an open surgery involving a larger incision in the lower abdomen.  Hormone treatment or birth control pills. These methods are sometimes used to help dissolve a cyst. HOME CARE INSTRUCTIONS   Only take over-the-counter   or prescription medicines as directed by your health care provider.  Follow up with your health care provider as directed.  Get regular pelvic exams and Pap tests. SEEK MEDICAL CARE IF:   Your periods are late, irregular, or painful, or they stop.  Your pelvic pain or abdominal pain does not go away.  Your abdomen becomes  larger or swollen.  You have pressure on your bladder or trouble emptying your bladder completely.  You have pain during sexual intercourse.  You have feelings of fullness, pressure, or discomfort in your stomach.  You lose weight for no apparent reason.  You feel generally ill.  You become constipated.  You lose your appetite.  You develop acne.  You have an increase in body and facial hair.  You are gaining weight, without changing your exercise and eating habits.  You think you are pregnant. SEEK IMMEDIATE MEDICAL CARE IF:   You have increasing abdominal pain.  You feel sick to your stomach (nauseous), and you throw up (vomit).  You develop a fever that comes on suddenly.  You have abdominal pain during a bowel movement.  Your menstrual periods become heavier than usual. MAKE SURE YOU:  Understand these instructions.  Will watch your condition.  Will get help right away if you are not doing well or get worse.   This information is not intended to replace advice given to you by your health care provider. Make sure you discuss any questions you have with your health care provider.   Document Released: 02/25/2005 Document Revised: 03/02/2013 Document Reviewed: 11/02/2012 Elsevier Interactive Patient Education 2016 [employer]  

## 2015-11-30 LAB — URINE CULTURE: Culture: <10000 — AB

## 2015-12-18 ENCOUNTER — Other Ambulatory Visit: Payer: Self-pay | Admitting: Pediatrics

## 2015-12-19 NOTE — Telephone Encounter (Signed)
Authorize 30 days only. Then contact the patient letting them know that they will need an appointment before any further prescriptions can be sent in. 

## 2016-01-18 ENCOUNTER — Other Ambulatory Visit: Payer: Self-pay | Admitting: Family Medicine

## 2016-03-15 ENCOUNTER — Other Ambulatory Visit: Payer: Self-pay | Admitting: Family Medicine

## 2016-03-15 MED ORDER — SUMATRIPTAN SUCCINATE 100 MG PO TABS: ORAL_TABLET | ORAL | 0 refills | Status: DC

## 2016-03-15 NOTE — Telephone Encounter (Signed)
Tried to contact patient back. No answer/ no vm.

## 2016-03-15 NOTE — Telephone Encounter (Signed)
Please advise. Patient was last seen 04/21/15.

## 2016-04-12 NOTE — Telephone Encounter (Signed)
Multiple attempts made to contact patient.  This encounter will now be closed  

## 2016-05-01 ENCOUNTER — Other Ambulatory Visit: Payer: Self-pay | Admitting: Pediatrics

## 2016-05-08 ENCOUNTER — Telehealth: Payer: Self-pay | Admitting: Pediatrics

## 2016-05-08 MED ORDER — SUMATRIPTAN SUCCINATE 100 MG PO TABS: ORAL_TABLET | ORAL | 0 refills | Status: DC

## 2016-05-08 NOTE — Telephone Encounter (Signed)
appt 05/13/16 at 3 pm with vincent - can we fill the imitrex until then

## 2016-05-08 NOTE — Telephone Encounter (Signed)
What is the name of the medication? imitrex     Have you contacted your pharmacy to request a refill? Yes she was denied refill because she has not been seen but wants enough to last her until appt on 05/13/2016  Which [facility]   Patient notified that their request is being sent to the clinical staff for review and that they should receive a call once it is complete. If they do not receive a call within 24 hours they can check with their pharmacy or our office.

## 2016-05-08 NOTE — Telephone Encounter (Signed)
rx sent in 

## 2016-05-13 ENCOUNTER — Ambulatory Visit: Payer: BLUE CROSS/BLUE SHIELD | Admitting: Pediatrics

## 2016-05-17 ENCOUNTER — Ambulatory Visit: Payer: BLUE CROSS/BLUE SHIELD | Admitting: Pediatrics

## 2016-05-24 ENCOUNTER — Ambulatory Visit (INDEPENDENT_AMBULATORY_CARE_PROVIDER_SITE_OTHER): Payer: BLUE CROSS/BLUE SHIELD | Admitting: Family Medicine

## 2016-05-24 ENCOUNTER — Encounter: Payer: Self-pay | Admitting: Family Medicine

## 2016-05-24 VITALS — BP 148/84 | HR 85 | Temp 97.4°F | Ht 63.0 in | Wt 134.0 lb

## 2016-05-24 DIAGNOSIS — G4453 Primary thunderclap headache: Secondary | ICD-10-CM | POA: Diagnosis not present

## 2016-05-24 NOTE — Progress Notes (Signed)
Subjective:  Patient ID: [name], female    DOB: 17-Sep-1975  Age: 41 y.o. MRN: [id number]  CC: Migraine (pt here today c/o a headache that doesn't feel anthing like her normal migraines)   HPI [name] presents for Headache for 9 days. It is in the frontal region. She describes as as a dull ache. It is the worse. She's ever had. It is now 5-6/10. She gets no relief from Imitrex which usually helps with her migraines. Along with the pain she's had some tingling in the left side of her face and above her lip. This morning she awoke at 3 AM with her left hand feeling asleep. Subsequently she began to feel nauseous and went to bathroom and she looked very pale. She felt freezing cold. This got better when she went back and laid down, except that the hand still is tingly and feeling asleep several hours later. She has full range of motion and denies weakness of the left upper extremity. She notes that she's had to wear her glasses all the time. Usually they're just necessary for reading. However she feels that the headache has diminished her visual acuity.  History [name] has a past medical history of Anxiety; Arthritis; Chronic neck pain; Chronic pelvic pain in female (2003); Chronic shoulder pain; Endometriosis; Fibromyalgia; Kidney stone; Migraine headache; Panic attack; and Seizures ([place]).   She has a past surgical history that includes Vaginal hysterectomy; Appendectomy; and Cholecystectomy.   Her family history is not on file.She reports that she has never smoked. She has never used smokeless tobacco. She reports that she does not drink alcohol or use drugs.  Current Outpatient Prescriptions on File Prior to Visit  Medication Sig Dispense Refill  . Ascorbic Acid (VITAMIN C PO) Take 1 tablet by mouth daily.    . Cholecalciferol (VITAMIN D PO) Take 1 tablet by mouth daily.    . halobetasol (ULTRAVATE) 0.05 % cream Apply topically 2 (two) times daily. (Patient taking differently:  Apply 1 application topically 2 (two) times daily as needed (itching). ) 50 g 0  . Magnesium 250 MG TABS Take 500 mg by mouth 2 (two) times daily.    . SUMAtriptan (IMITREX) 100 MG tablet TAKE 1/2 TABLET AS NEEDED FOR MIGRAINE OR HEADACHE MAY REPEAT IN 2 HOURS IF HEADACHE PERSISTS 10 tablet 0   No current facility-administered medications on file prior to visit.     ROS Review of Systems  Constitutional: Positive for diaphoresis. Negative for activity change, appetite change and fever.  HENT: Negative for congestion, rhinorrhea and sore throat.   Eyes: Positive for visual disturbance. Negative for photophobia.  Respiratory: Negative for cough and shortness of breath.   Cardiovascular: Negative for chest pain and palpitations.  Gastrointestinal: Positive for nausea. Negative for abdominal pain and diarrhea.  Genitourinary: Negative for dysuria.  Musculoskeletal: Negative for arthralgias and myalgias.  Neurological: Positive for seizures (nothing recent however she did have seizures in the remote past), numbness and headaches. Negative for dizziness and facial asymmetry.  Psychiatric/Behavioral: The patient is nervous/anxious.     Objective:  BP (!) 148/84   Pulse 85   Temp 97.4 F (36.3 C) (Oral)   Ht 5' 3"  (1.6 m)   Wt 134 lb (60.8 kg)   BMI 23.74 kg/m   Physical Exam  Constitutional: She is oriented to person, place, and time. She appears well-developed and well-nourished. No distress.  HENT:  Head: Normocephalic and atraumatic.  Right Ear: External ear normal.  Left Ear: External ear normal.  Nose: Nose normal.  Mouth/Throat: Oropharynx is clear and moist.  Eyes: Conjunctivae and EOM are normal. Pupils are equal, round, and reactive to light.  Neck: Normal range of motion. Neck supple. No thyromegaly present.  Cardiovascular: Normal rate, regular rhythm and normal heart sounds.   No murmur heard. Pulmonary/Chest: Effort normal and breath sounds normal. No respiratory  distress. She has no wheezes. She has no rales.  Abdominal: Soft. Bowel sounds are normal. She exhibits no distension. There is no tenderness.  Lymphadenopathy:    She has no cervical adenopathy.  Neurological: She is alert and oriented to person, place, and time. She has normal reflexes.  Skin: Skin is warm and dry.  Psychiatric: Thought content normal. Her mood appears anxious. Her speech is rapid and/or pressured. She is hyperactive. Cognition and memory are normal. She expresses impulsivity.    Assessment & Plan:   Valoree was seen today for migraine.  Diagnoses and all orders for this visit:  Primary thunderclap headache -     CBC with Differential/Platelet -     CMP14+EGFR -     Sedimentation rate -     MR BRAIN W WO CONTRAST; Future   I have discontinued Ms. [name]'s ketorolac, ondansetron, prochlorperazine, oxyCODONE-acetaminophen, and fluconazole. I am also having her maintain her Cholecalciferol (VITAMIN D PO), Ascorbic Acid (VITAMIN C PO), Magnesium, halobetasol, and SUMAtriptan.  No orders of the defined types were placed in this encounter.  The headache has some unusual focal symptoms but these are not worn out on exam with regard to weakness that is lateralizing. At this time workup is been initiated and she will follow-up with her primary care Dr. Evette Doffing in the next few days.  Follow-up: Return in about 5 days (around 05/29/2016) for With Dr. Evette Doffing.  Claretta Fraise, M.D.

## 2016-05-25 LAB — CBC WITH DIFFERENTIAL/PLATELET
Basophils Absolute: 0.1 10*3/uL (ref 0.0–0.2)
Basos: 1 %
EOS (ABSOLUTE): 0.1 10*3/uL (ref 0.0–0.4)
Eos: 1 %
Hematocrit: 40.5 % (ref 34.0–46.6)
Hemoglobin: 13.6 g/dL (ref 11.1–15.9)
Immature Grans (Abs): 0 10*3/uL (ref 0.0–0.1)
Immature Granulocytes: 0 %
Lymphocytes Absolute: 2 10*3/uL (ref 0.7–3.1)
Lymphs: 25 %
MCH: 29.8 pg (ref 26.6–33.0)
MCHC: 33.6 g/dL (ref 31.5–35.7)
MCV: 89 fL (ref 79–97)
Monocytes Absolute: 0.6 10*3/uL (ref 0.1–0.9)
Monocytes: 8 %
Neutrophils Absolute: 5.1 10*3/uL (ref 1.4–7.0)
Neutrophils: 65 %
Platelets: 303 10*3/uL (ref 150–379)
RBC: 4.56 x10E6/uL (ref 3.77–5.28)
RDW: 13.3 % (ref 12.3–15.4)
WBC: 7.9 10*3/uL (ref 3.4–10.8)

## 2016-05-25 LAB — CMP14+EGFR
ALT: 22 IU/L (ref 0–32)
AST: 13 IU/L (ref 0–40)
Albumin/Globulin Ratio: 1.7 (ref 1.2–2.2)
Albumin: 4.4 g/dL (ref 3.5–5.5)
Alkaline Phosphatase: 59 IU/L (ref 39–117)
BUN/Creatinine Ratio: 21 (ref 9–23)
BUN: 14 mg/dL (ref 6–24)
Bilirubin Total: 0.4 mg/dL (ref 0.0–1.2)
CO2: 21 mmol/L (ref 18–29)
Calcium: 9.1 mg/dL (ref 8.7–10.2)
Chloride: 100 mmol/L (ref 96–106)
Creatinine, Ser: 0.66 mg/dL (ref 0.57–1.00)
GFR calc Af Amer: 126 mL/min/{1.73_m2} (ref >59)
GFR calc non Af Amer: 109 mL/min/{1.73_m2} (ref >59)
Globulin, Total: 2.6 g/dL (ref 1.5–4.5)
Glucose: 83 mg/dL (ref 65–99)
Potassium: 4.5 mmol/L (ref 3.5–5.2)
Sodium: 138 mmol/L (ref 134–144)
Total Protein: 7 g/dL (ref 6.0–8.5)

## 2016-05-25 LAB — SEDIMENTATION RATE: Sed Rate: 7 mm/hr (ref 0–32)

## 2016-06-03 ENCOUNTER — Other Ambulatory Visit: Payer: Self-pay | Admitting: Pediatrics

## 2016-06-26 ENCOUNTER — Ambulatory Visit (HOSPITAL_COMMUNITY): Payer: BLUE CROSS/BLUE SHIELD

## 2016-07-05 ENCOUNTER — Ambulatory Visit (HOSPITAL_COMMUNITY): Payer: BLUE CROSS/BLUE SHIELD

## 2016-07-12 ENCOUNTER — Telehealth: Payer: Self-pay | Admitting: Pediatrics

## 2016-07-12 ENCOUNTER — Other Ambulatory Visit: Payer: Self-pay | Admitting: *Deleted

## 2016-07-12 ENCOUNTER — Ambulatory Visit (HOSPITAL_COMMUNITY)
Admission: RE | Admit: 2016-07-12 | Discharge: 2016-07-12 | Disposition: A | Discharge status: Home / Self Care | Payer: BLUE CROSS/BLUE SHIELD | Source: Ambulatory Visit | Attending: Family Medicine | Admitting: Family Medicine

## 2016-07-12 ENCOUNTER — Other Ambulatory Visit: Payer: Self-pay | Admitting: Family Medicine

## 2016-07-12 DIAGNOSIS — G4453 Primary thunderclap headache: Secondary | ICD-10-CM | POA: Diagnosis present

## 2016-07-12 MED ORDER — GADOBENATE DIMEGLUMINE 529 MG/ML IV SOLN
15.0000 mL | Freq: Once | INTRAVENOUS | Status: AC | PRN
  Administered 2016-07-12: 12 mL via INTRAVENOUS

## 2016-07-12 NOTE — Telephone Encounter (Signed)
Is someone else driving her? If so, can send ativan 0.5mg  tabs x 3 tabs, no refills. Take q5330min prn. Must have someone else drive her though.

## 2016-07-12 NOTE — Telephone Encounter (Signed)
Pt notified of RX RX for Ativan called into [facility]

## 2016-08-06 ENCOUNTER — Other Ambulatory Visit: Payer: Self-pay | Admitting: Pediatrics

## 2016-08-06 MED ORDER — SUMATRIPTAN SUCCINATE 100 MG PO TABS: ORAL_TABLET | ORAL | 0 refills | Status: AC

## 2016-08-06 NOTE — Telephone Encounter (Signed)
What is the name of the medication? Sumatriptan 100 mg  Have you contacted your pharmacy to request a refill? YES  Which pharmacy would you [facility]  [place]   Patient notified that their request is being sent to the clinical staff for review and that they should receive a call once it is complete. If they do not receive a call within 24 hours they can check with their pharmacy or our office.

## 2016-08-06 NOTE — Telephone Encounter (Signed)
Medication sent to pharmacy  

## 2016-08-06 NOTE — Telephone Encounter (Signed)
Last filled 07/12/16, has severe headache, [name] is off

## 2016-09-30 ENCOUNTER — Other Ambulatory Visit: Payer: Self-pay | Admitting: Obstetrics and Gynecology

## 2016-09-30 DIAGNOSIS — Z1231 Encounter for screening mammogram for malignant neoplasm of breast: Secondary | ICD-10-CM

## 2016-12-12 DIAGNOSIS — R51 Headache: Secondary | ICD-10-CM

## 2016-12-12 DIAGNOSIS — R519 Headache, unspecified: Secondary | ICD-10-CM | POA: Insufficient documentation

## 2016-12-16 ENCOUNTER — Telehealth: Payer: Self-pay | Admitting: *Deleted

## 2016-12-16 NOTE — Telephone Encounter (Signed)
NOTES SENT TO SCHEDULING.  °

## 2017-01-09 DIAGNOSIS — F419 Anxiety disorder, unspecified: Secondary | ICD-10-CM | POA: Insufficient documentation

## 2017-01-10 ENCOUNTER — Encounter: Payer: Self-pay | Admitting: Internal Medicine

## 2017-01-24 ENCOUNTER — Ambulatory Visit: Payer: BLUE CROSS/BLUE SHIELD | Admitting: Internal Medicine

## 2017-03-14 ENCOUNTER — Ambulatory Visit: Payer: BLUE CROSS/BLUE SHIELD | Admitting: Internal Medicine

## 2017-03-17 ENCOUNTER — Ambulatory Visit
Admission: RE | Admit: 2017-03-17 | Discharge: 2017-03-17 | Disposition: A | Discharge status: Home / Self Care | Payer: BLUE CROSS/BLUE SHIELD | Source: Ambulatory Visit | Attending: Obstetrics and Gynecology | Admitting: Obstetrics and Gynecology

## 2017-03-17 DIAGNOSIS — Z1231 Encounter for screening mammogram for malignant neoplasm of breast: Secondary | ICD-10-CM

## 2017-03-18 ENCOUNTER — Other Ambulatory Visit: Payer: Self-pay | Admitting: Obstetrics and Gynecology

## 2017-03-18 DIAGNOSIS — R928 Other abnormal and inconclusive findings on diagnostic imaging of breast: Secondary | ICD-10-CM

## 2017-03-21 ENCOUNTER — Ambulatory Visit
Admission: RE | Admit: 2017-03-21 | Discharge: 2017-03-21 | Disposition: A | Discharge status: Home / Self Care | Payer: BLUE CROSS/BLUE SHIELD | Source: Ambulatory Visit | Attending: Obstetrics and Gynecology | Admitting: Obstetrics and Gynecology

## 2017-03-21 DIAGNOSIS — R928 Other abnormal and inconclusive findings on diagnostic imaging of breast: Secondary | ICD-10-CM

## 2017-04-25 ENCOUNTER — Ambulatory Visit: Payer: BLUE CROSS/BLUE SHIELD | Admitting: Internal Medicine

## 2017-05-01 ENCOUNTER — Encounter: Payer: Self-pay | Admitting: Internal Medicine

## 2017-06-06 DIAGNOSIS — G43119 Migraine with aura, intractable, without status migrainosus: Secondary | ICD-10-CM | POA: Insufficient documentation

## 2017-06-19 ENCOUNTER — Ambulatory Visit: Payer: BLUE CROSS/BLUE SHIELD | Admitting: Internal Medicine

## 2017-11-03 ENCOUNTER — Emergency Department (HOSPITAL_COMMUNITY): Payer: BLUE CROSS/BLUE SHIELD

## 2017-11-03 ENCOUNTER — Emergency Department (HOSPITAL_COMMUNITY)
Admission: EM | Admit: 2017-11-03 | Discharge: 2017-11-03 | Disposition: A | Discharge status: Home / Self Care | Payer: BLUE CROSS/BLUE SHIELD | Attending: Emergency Medicine | Admitting: Emergency Medicine

## 2017-11-03 ENCOUNTER — Encounter (HOSPITAL_COMMUNITY): Payer: Self-pay | Admitting: Emergency Medicine

## 2017-11-03 ENCOUNTER — Other Ambulatory Visit: Payer: Self-pay

## 2017-11-03 DIAGNOSIS — R079 Chest pain, unspecified: Secondary | ICD-10-CM | POA: Diagnosis present

## 2017-11-03 DIAGNOSIS — R55 Syncope and collapse: Secondary | ICD-10-CM | POA: Diagnosis not present

## 2017-11-03 DIAGNOSIS — Z79899 Other long term (current) drug therapy: Secondary | ICD-10-CM | POA: Insufficient documentation

## 2017-11-03 DIAGNOSIS — Z91040 Latex allergy status: Secondary | ICD-10-CM | POA: Diagnosis not present

## 2017-11-03 DIAGNOSIS — R0789 Other chest pain: Secondary | ICD-10-CM | POA: Diagnosis not present

## 2017-11-03 LAB — CBC
HCT: 40.3 % (ref 36.0–46.0)
Hemoglobin: 13.6 g/dL (ref 12.0–15.0)
MCH: 31.2 pg (ref 26.0–34.0)
MCHC: 33.7 g/dL (ref 30.0–36.0)
MCV: 92.4 fL (ref 78.0–100.0)
Platelets: 285 10*3/uL (ref 150–400)
RBC: 4.36 MIL/uL (ref 3.87–5.11)
RDW: 12.2 % (ref 11.5–15.5)
WBC: 8.4 10*3/uL (ref 4.0–10.5)

## 2017-11-03 LAB — URINALYSIS, ROUTINE W REFLEX MICROSCOPIC
Bilirubin Urine: NEGATIVE
Glucose, UA: NEGATIVE mg/dL
Hgb urine dipstick: NEGATIVE
Ketones, ur: 5 mg/dL — AB
Leukocytes, UA: NEGATIVE
Nitrite: NEGATIVE
Protein, ur: NEGATIVE mg/dL
Specific Gravity, Urine: 1.009 (ref 1.005–1.030)
pH: 7 (ref 5.0–8.0)

## 2017-11-03 LAB — BASIC METABOLIC PANEL
Anion gap: 7 (ref 5–15)
BUN: 15 mg/dL (ref 6–20)
CO2: 27 mmol/L (ref 22–32)
Calcium: 9.1 mg/dL (ref 8.9–10.3)
Chloride: 105 mmol/L (ref 98–111)
Creatinine, Ser: 0.66 mg/dL (ref 0.44–1.00)
GFR calc Af Amer: >60 mL/min (ref >60)
GFR calc non Af Amer: >60 mL/min (ref >60)
Glucose, Bld: 90 mg/dL (ref 70–99)
Potassium: 4.5 mmol/L (ref 3.5–5.1)
Sodium: 139 mmol/L (ref 135–145)

## 2017-11-03 LAB — HEPATIC FUNCTION PANEL
ALT: 42 U/L (ref 0–44)
AST: 29 U/L (ref 15–41)
Albumin: 4.3 g/dL (ref 3.5–5.0)
Alkaline Phosphatase: 52 U/L (ref 38–126)
Bilirubin, Direct: <0.1 mg/dL (ref 0.0–0.2)
Total Bilirubin: 0.4 mg/dL (ref 0.3–1.2)
Total Protein: 7.7 g/dL (ref 6.5–8.1)

## 2017-11-03 LAB — TROPONIN I: Troponin I: <0.03 ng/mL (ref <0.03)

## 2017-11-03 MED ORDER — SUMATRIPTAN SUCCINATE 25 MG PO TABS
100.0000 mg | ORAL_TABLET | Freq: Once | ORAL | Status: AC
  Administered 2017-11-03: 100 mg via ORAL
  Filled 2017-11-03: qty 4

## 2017-11-03 NOTE — Discharge Instructions (Signed)
Take 1 baby aspirin a day and follow-up with the cardiologist and your family doctor next week

## 2017-11-03 NOTE — ED Notes (Signed)
EKG given to Dr. Zammit 

## 2017-11-03 NOTE — ED Triage Notes (Addendum)
"  weird" feeling in her chest, pain to lt upper arm and lt jaw x 1 week.  Also reports nausea and fatigue.  States she passed out on Saturday and hit the front of her head on the floor.

## 2017-11-03 NOTE — ED Provider Notes (Signed)
[facility] EMERGENCY DEPARTMENT Provider Note   CSN: [id number] Arrival date & time: 11/03/17  1442     History   Chief Complaint Chief Complaint  Patient presents with  . Jaw Pain    HPI [name] is a 42 y.o. female.  Patient states that she has been having some chest pain jaw pain is been having no energy.  Also patient became dizzy once passed out and hit her head.  The history is provided by the patient. No language interpreter was used.  Chest Pain   This is a new problem. The current episode started more than 1 week ago. The problem occurs rarely. The problem has been resolved. The pain is associated with exertion. The pain is present in the substernal region. The pain is at a severity of 5/10. The pain is mild. The quality of the pain is described as dull. The pain does not radiate. The symptoms are aggravated by exertion. Associated symptoms include dizziness. Pertinent negatives include no abdominal pain, no back pain, no cough and no headaches.  Pertinent negatives for past medical history include no seizures.    Past Medical History:  Diagnosis Date  . Anxiety   . Arthritis   . Chronic neck pain   . Chronic pelvic pain in female 2003  . Chronic shoulder pain   . Endometriosis   . Fibromyalgia   . Kidney stone   . Migraine headache    "since 42 yrs old"  . Panic attack   . Seizures ([facility])    related to migraine headaches    Patient Active Problem List   Diagnosis Date Noted  . Avascular necrosis of bones of both hips (HCC) 11/19/2014  . Fibrositis 02/28/2014  . Headache, migraine 02/28/2014    Past Surgical History:  Procedure Laterality Date  . APPENDECTOMY    . CHOLECYSTECTOMY    . VAGINAL HYSTERECTOMY       OB History    Gravida      Para      Term      Preterm      AB      Living  2     SAB      TAB      Ectopic      Multiple      Live Births               Home Medications    Prior to Admission medications    Medication Sig Start Date End Date Taking? Authorizing Provider  Ascorbic Acid (VITAMIN C PO) Take 1 tablet by mouth daily.    [provider]  Cholecalciferol (VITAMIN D PO) Take 1 tablet by mouth daily.    [provider]  halobetasol (ULTRAVATE) 0.05 % cream Apply topically 2 (two) times daily. Patient taking differently: Apply 1 application topically 2 (two) times daily as needed (itching).  04/21/15   Johna Sheriff, MD  Magnesium 250 MG TABS Take 500 mg by mouth 2 (two) times daily.    [provider]  SUMAtriptan (IMITREX) 100 MG tablet TAKE 1/2 TABLET AS NEEDED FOR MIGRAINE OR HEADACHE MAY REPEAT IN 2 HOURS IF HEADACHE PERSISTS 08/06/16   Mechele Claude, MD    Family History Family History  Problem Relation Age of Onset  . Breast cancer Maternal Grandmother     Social History Social History   Tobacco Use  . Smoking status: Never Smoker  . Smokeless tobacco: Never Used  Substance Use Topics  .  Alcohol use: No  . Drug use: No     Allergies   Codeine; Hydrocodone; Latex; and Morphine and related   Review of Systems Review of Systems  Constitutional: Negative for appetite change and fatigue.  HENT: Negative for congestion, ear discharge and sinus pressure.   Eyes: Negative for discharge.  Respiratory: Negative for cough.   Cardiovascular: Positive for chest pain.  Gastrointestinal: Negative for abdominal pain and diarrhea.  Genitourinary: Negative for frequency and hematuria.  Musculoskeletal: Negative for back pain.  Skin: Negative for rash.  Neurological: Positive for dizziness. Negative for seizures and headaches.  Psychiatric/Behavioral: Negative for hallucinations.     Physical Exam Updated Vital Signs BP 116/79   Pulse 87   Temp (!) 97.3 F (36.3 C) (Oral)   Resp (!) 26   Ht 5\' 3"  (1.6 m)   Wt 63.5 kg   SpO2 99%   BMI 24.80 kg/m   Physical Exam  Constitutional: She is oriented to person, place, and time. She appears  well-developed.  HENT:  Head: Normocephalic.  Eyes: Conjunctivae and EOM are normal. No scleral icterus.  Neck: Neck supple. No thyromegaly present.  Cardiovascular: Normal rate and regular rhythm. Exam reveals no gallop and no friction rub.  No murmur heard. Pulmonary/Chest: No stridor. She has no wheezes. She has no rales. She exhibits no tenderness.  Abdominal: She exhibits no distension. There is no tenderness. There is no rebound.  Musculoskeletal: Normal range of motion. She exhibits no edema.  Lymphadenopathy:    She has no cervical adenopathy.  Neurological: She is oriented to person, place, and time. She exhibits normal muscle tone. Coordination normal.  Skin: No rash noted. No erythema.  Psychiatric: She has a normal mood and affect. Her behavior is normal.     ED Treatments / Results  Labs (all labs ordered are listed, but only abnormal results are displayed) Labs Reviewed  URINALYSIS, ROUTINE W REFLEX MICROSCOPIC - Abnormal; Notable for the following components:      Result Value   Color, Urine STRAW (*)    Ketones, ur 5 (*)    All other components within normal limits  BASIC METABOLIC PANEL  CBC  TROPONIN I  HEPATIC FUNCTION PANEL    EKG EKG Interpretation  Date/Time:  Monday November 03 2017 15:04:21 EDT Ventricular Rate:  82 PR Interval:  128 QRS Duration: 76 QT Interval:  348 QTC Calculation: 406 R Axis:   60 Text Interpretation:  Normal sinus rhythm with sinus arrhythmia Cannot rule out Anterior infarct , age undetermined Abnormal ECG Confirmed by Bethann Berkshire [phone]) on 11/03/2017 5:04:11 PM   Radiology Dg Chest 2 View  Result Date: 11/03/2017 CLINICAL DATA:  Left jaw pain radiating into the left arm associated with fatigue and shortness of breath. Symptoms began last week. EXAM: CHEST - 2 VIEW COMPARISON:  Chest x-ray of March 12, 2017 FINDINGS: The lungs are adequately inflated. There is no focal infiltrate. There is no pleural effusion. The heart  and pulmonary vascularity are normal. The mediastinum is normal in width. The trachea is midline. The bony thorax exhibits no acute abnormality. IMPRESSION: There is no active cardiopulmonary disease. Electronically Signed   By: David  [place] M.D.   On: 11/03/2017 15:19   Ct Head Wo Contrast  Result Date: 11/03/2017 CLINICAL DATA:  Syncopal episode on Saturday hitting front of head on the floor. EXAM: CT HEAD WITHOUT CONTRAST CT CERVICAL SPINE WITHOUT CONTRAST TECHNIQUE: Multidetector CT imaging of the head and cervical spine was performed  following the standard protocol without intravenous contrast. Multiplanar CT image reconstructions of the cervical spine were also generated. COMPARISON:  MRI 07/12/2016, CT 12/22/2010 of the brain FINDINGS: CT HEAD FINDINGS BRAIN: The ventricles and sulci are normal. No intraparenchymal hemorrhage, mass effect nor midline shift. No acute large vascular territory infarcts. No abnormal extra-axial fluid collections. Basal cisterns are midline and not effaced. No acute cerebellar abnormality. VASCULAR: Unremarkable. SKULL/SOFT TISSUES: No skull fracture. No significant soft tissue swelling. ORBITS/SINUSES: The included ocular globes and orbital contents are normal.The mastoid air-cells and included paranasal sinuses are well-aerated. OTHER: None. CT CERVICAL SPINE FINDINGS ALIGNMENT: Vertebral bodies in alignment. Mild straightening and reversal of cervical lordosis. This may be on the basis of muscle spasm patient positioning. SKULL BASE AND VERTEBRAE: Cervical vertebral bodies and posterior elements are intact. Intervertebral disc heights preserved. No destructive bony lesions. C1-2 articulation maintained. Developmentally incomplete posterior arch of C1. SOFT TISSUES AND SPINAL CANAL: Normal. DISC LEVELS: No significant osseous canal stenosis or neural foraminal narrowing. Moderate disc flattening C2-3 and mild disc flattening C4-5. No jumped or perched facets. No significant  central foraminal stenosis. Uncovertebral joint osteoarthritis with uncinate spurring is seen in the right at C3-4 and bilaterally at C4-5. UPPER CHEST: Lung apices are clear. OTHER: None. IMPRESSION: 1. Normal head CT. 2. Degenerative disc disease C2-3 and C4-5 with uncovertebral joint osteoarthritis on the right at C3-4 and bilaterally at C4-5. No acute osseous abnormality. Electronically Signed   By: Tollie Eth M.D.   On: 11/03/2017 17:59   Ct Cervical Spine Wo Contrast  Result Date: 11/03/2017 CLINICAL DATA:  Syncopal episode on Saturday hitting front of head on the floor. EXAM: CT HEAD WITHOUT CONTRAST CT CERVICAL SPINE WITHOUT CONTRAST TECHNIQUE: Multidetector CT imaging of the head and cervical spine was performed following the standard protocol without intravenous contrast. Multiplanar CT image reconstructions of the cervical spine were also generated. COMPARISON:  MRI 07/12/2016, CT 12/22/2010 of the brain FINDINGS: CT HEAD FINDINGS BRAIN: The ventricles and sulci are normal. No intraparenchymal hemorrhage, mass effect nor midline shift. No acute large vascular territory infarcts. No abnormal extra-axial fluid collections. Basal cisterns are midline and not effaced. No acute cerebellar abnormality. VASCULAR: Unremarkable. SKULL/SOFT TISSUES: No skull fracture. No significant soft tissue swelling. ORBITS/SINUSES: The included ocular globes and orbital contents are normal.The mastoid air-cells and included paranasal sinuses are well-aerated. OTHER: None. CT CERVICAL SPINE FINDINGS ALIGNMENT: Vertebral bodies in alignment. Mild straightening and reversal of cervical lordosis. This may be on the basis of muscle spasm patient positioning. SKULL BASE AND VERTEBRAE: Cervical vertebral bodies and posterior elements are intact. Intervertebral disc heights preserved. No destructive bony lesions. C1-2 articulation maintained. Developmentally incomplete posterior arch of C1. SOFT TISSUES AND SPINAL CANAL: Normal.  DISC LEVELS: No significant osseous canal stenosis or neural foraminal narrowing. Moderate disc flattening C2-3 and mild disc flattening C4-5. No jumped or perched facets. No significant central foraminal stenosis. Uncovertebral joint osteoarthritis with uncinate spurring is seen in the right at C3-4 and bilaterally at C4-5. UPPER CHEST: Lung apices are clear. OTHER: None. IMPRESSION: 1. Normal head CT. 2. Degenerative disc disease C2-3 and C4-5 with uncovertebral joint osteoarthritis on the right at C3-4 and bilaterally at C4-5. No acute osseous abnormality. Electronically Signed   By: Tollie Eth M.D.   On: 11/03/2017 17:59    Procedures Procedures (including critical care time)  Medications Ordered in ED Medications  SUMAtriptan (IMITREX) tablet 100 mg (has no administration in time range)  Initial Impression / Assessment and Plan / ED Course  I have reviewed the triage vital signs and the nursing notes.  Pertinent labs & imaging results that were available during my care of the patient were reviewed by me and considered in my medical decision making (see chart for details).     CBC chemistries troponin EKG chest x-ray CT had all unremarkable.  Patient with some chest pain and fatigue with syncope.  She is referred to cardiology for possible stress test and will follow back up with her family doctor to  Final Clinical Impressions(s) / ED Diagnoses   Final diagnoses:  Atypical chest pain    ED Discharge Orders    None       Bethann BerkshireZammit, Stevens Magwood, MD 11/03/17 1948

## 2017-11-05 DIAGNOSIS — N809 Endometriosis, unspecified: Secondary | ICD-10-CM | POA: Insufficient documentation

## 2017-11-05 DIAGNOSIS — M797 Fibromyalgia: Secondary | ICD-10-CM | POA: Insufficient documentation

## 2017-11-05 DIAGNOSIS — R569 Unspecified convulsions: Secondary | ICD-10-CM | POA: Insufficient documentation

## 2017-11-05 DIAGNOSIS — N200 Calculus of kidney: Secondary | ICD-10-CM | POA: Insufficient documentation

## 2017-11-13 ENCOUNTER — Ambulatory Visit: Payer: BLUE CROSS/BLUE SHIELD | Admitting: Cardiology

## 2017-11-13 DIAGNOSIS — B009 Herpesviral infection, unspecified: Secondary | ICD-10-CM | POA: Insufficient documentation

## 2017-11-13 DIAGNOSIS — N943 Premenstrual tension syndrome: Secondary | ICD-10-CM | POA: Insufficient documentation

## 2017-11-13 DIAGNOSIS — R32 Unspecified urinary incontinence: Secondary | ICD-10-CM | POA: Insufficient documentation

## 2018-01-06 ENCOUNTER — Ambulatory Visit (INDEPENDENT_AMBULATORY_CARE_PROVIDER_SITE_OTHER): Payer: BLUE CROSS/BLUE SHIELD | Admitting: Cardiology

## 2018-01-06 ENCOUNTER — Encounter: Payer: Self-pay | Admitting: Cardiology

## 2018-01-06 VITALS — BP 116/82 | HR 94 | Wt 145.0 lb

## 2018-01-06 DIAGNOSIS — Z8669 Personal history of other diseases of the nervous system and sense organs: Secondary | ICD-10-CM | POA: Diagnosis not present

## 2018-01-06 DIAGNOSIS — R0789 Other chest pain: Secondary | ICD-10-CM

## 2018-01-06 DIAGNOSIS — Z8739 Personal history of other diseases of the musculoskeletal system and connective tissue: Secondary | ICD-10-CM | POA: Diagnosis not present

## 2018-01-06 NOTE — Patient Instructions (Addendum)
Medication Instructions:  Your physician recommends that you continue on your current medications as directed. Please refer to the Current Medication list given to you today.  If you need a refill on your cardiac medications before your next appointment, please call your pharmacy.   Lab work: NONE If you have labs (blood work) drawn today and your tests are completely normal, you will receive your results only by: Marland Kitchen MyChart Message (if you have MyChart) OR . A paper copy in the mail If you have any lab test that is abnormal or we need to change your treatment, we will call you to review the results.  Testing/Procedures: Your physician has requested that you have a stress echocardiogram. For further information please visit [email or web address] Please follow instruction sheet as given.    FOLLOW UP: We will call you with results  Any Other Special Instructions Will Be Listed Below (If Applicable). NONE

## 2018-01-06 NOTE — Progress Notes (Signed)
Cardiology Office Note  Date: 01/06/2018   ID: [name], DOB 1975-12-13, MRN [id number]  PCP: Eartha Inch, MD  Consulting Cardiologist: Nona Dell, MD   Chief Complaint  Patient presents with  . Chest Pain    History of Present Illness: [name] is a 42 y.o. female referred for cardiology consultation by Dr. Estell Harpin after ER evaluation for chest pain in August.  She states that she has had intermittent episodes of atypical chest pain, a "churning" sensation or feeling of "air."  This is not specifically associated with activity on every occasion.  She feels like she does not have as much stamina as she used to.  She does report chronic problems related to fibromyalgia and also recurring migraine headaches however.  She does not have any personal cardiac risk factors to include hypertension, type 2 diabetes mellitus, or hyperlipidemia.  No history of tobacco use.  She does report premature CAD in her father.  I reviewed her recent ECG and lab work from ER visit.  She has not undergone any prior objective ischemic testing.  Past Medical History:  Diagnosis Date  . Anxiety   . Arthritis   . Chronic pain   . Endometriosis   . Fibromyalgia   . History of seizures    Reportedly related to migraine headaches  . Kidney stone   . Migraine headache     Past Surgical History:  Procedure Laterality Date  . APPENDECTOMY    . CHOLECYSTECTOMY    . VAGINAL HYSTERECTOMY      Current Outpatient Medications  Medication Sig Dispense Refill  . Ascorbic Acid (VITAMIN C PO) Take 1 tablet by mouth daily.    . Cholecalciferol (VITAMIN D PO) Take 1 tablet by mouth daily.    . halobetasol (ULTRAVATE) 0.05 % cream Apply topically 2 (two) times daily. (Patient taking differently: Apply 1 application topically 2 (two) times daily as needed (itching). ) 50 g 0  . Magnesium 250 MG TABS Take 500 mg by mouth 2 (two) times daily.    . SUMAtriptan (IMITREX) 100 MG tablet TAKE  1/2 TABLET AS NEEDED FOR MIGRAINE OR HEADACHE MAY REPEAT IN 2 HOURS IF HEADACHE PERSISTS 10 tablet 0   No current facility-administered medications for this visit.    Allergies:  Codeine; Hydrocodone; Latex; and Morphine and related   Social History: The patient  reports that she has never smoked. She has never used smokeless tobacco. She reports that she drinks alcohol. She reports that she does not use drugs.   Family History: The patient's family history includes Breast cancer in her maternal grandmother; Diabetes Mellitus II in her father; Heart disease in her father; Pancreatic cancer in her father.   ROS:  Please see the history of present illness. Otherwise, complete review of systems is positive for remittent migraines and chronic pain.  All other systems are reviewed and negative.   Physical Exam: VS:  BP 116/82   Pulse 94   Wt 145 lb (65.8 kg)   SpO2 98%   BMI 25.69 kg/m , BMI Body mass index is 25.69 kg/m.  Wt Readings from Last 3 Encounters:  01/06/18 145 lb (65.8 kg)  11/03/17 140 lb (63.5 kg)  05/24/16 134 lb (60.8 kg)    General: Patient appears comfortable at rest. HEENT: Conjunctiva and lids normal, oropharynx clear. Neck: Supple, no elevated JVP or carotid bruits, no thyromegaly. Lungs: Clear to auscultation, nonlabored breathing at rest. Cardiac: Regular rate and rhythm, no  S3 or significant systolic murmur, no pericardial rub. Abdomen: Soft, nontender, bowel sounds present. Extremities: No pitting edema, distal pulses 2+. Skin: Warm and dry. Musculoskeletal: No kyphosis. Neuropsychiatric: Alert and oriented x3, affect grossly appropriate.  ECG: I personally reviewed the tracing from 11/03/2017 which showed sinus arrhythmia, decreased R wave progression.  Recent Labwork: 11/03/2017: ALT 42; AST 29; BUN 15; Creatinine, Ser 0.66; Hemoglobin 13.6; Platelets 285; Potassium 4.5; Sodium 139, troponin I less than 0.032  Other Studies Reviewed Today:  Chest x-ray  11/03/2017: FINDINGS: The lungs are adequately inflated. There is no focal infiltrate. There is no pleural effusion. The heart and pulmonary vascularity are normal. The mediastinum is normal in width. The trachea is midline. The bony thorax exhibits no acute abnormality.  IMPRESSION: There is no active cardiopulmonary disease.  Assessment and Plan:  1.  Atypical chest pain in a 42 year old woman with a history of premature CAD in her father.  No other known personal cardiac risk factors.  Troponin I levels were negative at ER visit in August and her ECG is nonspecific with decreased R wave progression.  Plan is to obtain an exercise echocardiogram for objective ischemic assessment.  2.  History of fibromyalgia.  3.  History of migraines.  Current medicines were reviewed with the patient today.   Orders Placed This Encounter  Procedures  . ECHOCARDIOGRAM STRESS TEST    Disposition: Call with test results.  Signed, Jonelle Sidle, MD, [facility] 01/06/2018 3:27 PM    [facility] [address], [place], [place] [address] Phone: [phone]; Fax: [phone]

## 2018-01-12 ENCOUNTER — Emergency Department (HOSPITAL_COMMUNITY): Payer: BLUE CROSS/BLUE SHIELD

## 2018-01-12 ENCOUNTER — Other Ambulatory Visit: Payer: Self-pay

## 2018-01-12 ENCOUNTER — Emergency Department (HOSPITAL_COMMUNITY)
Admission: EM | Admit: 2018-01-12 | Discharge: 2018-01-13 | Disposition: A | Discharge status: Home / Self Care | Payer: BLUE CROSS/BLUE SHIELD | Attending: Emergency Medicine | Admitting: Emergency Medicine

## 2018-01-12 ENCOUNTER — Encounter (HOSPITAL_COMMUNITY): Payer: Self-pay | Admitting: Emergency Medicine

## 2018-01-12 DIAGNOSIS — R059 Cough, unspecified: Secondary | ICD-10-CM

## 2018-01-12 DIAGNOSIS — M542 Cervicalgia: Secondary | ICD-10-CM | POA: Diagnosis not present

## 2018-01-12 DIAGNOSIS — R05 Cough: Secondary | ICD-10-CM | POA: Diagnosis not present

## 2018-01-12 NOTE — ED Triage Notes (Signed)
Patient reports cough and right sided neck pain that started a week and a half ago. Patient states the right side of her neck is tender to touch and hurts when she swallows.

## 2018-01-12 NOTE — ED Provider Notes (Signed)
[facility] EMERGENCY DEPARTMENT Provider Note   CSN: [id number] Arrival date & time: 01/12/18  2046     History   Chief Complaint Chief Complaint  Patient presents with  . Cough    HPI [name] is a 42 y.o. female who presents with right sided neck pain. PMH of migraines and fibromyalgia. She reports symptoms have been going on for the past week and a half. The right side of her neck is very tender when she touches it. When she presses on the left side it hurts on the right. She can't feel any masses or nodules. She is worried something may be wrong with her thyroid or she has cancer. She states that she hasn't been sleeping because she will wake up and be drenched in sweat and sometimes can't breathe because her throat feels swollen and it hurts. It feels different from strep. She has had a dry cough as well. She denies fever. No ear pain or runny nose. She doesn't smoke.  HPI  Past Medical History:  Diagnosis Date  . Anxiety   . Arthritis   . Chronic pain   . Endometriosis   . Fibromyalgia   . History of seizures    Reportedly related to migraine headaches  . Kidney stone   . Migraine headache     Patient Active Problem List   Diagnosis Date Noted  . Herpes simplex 11/13/2017  . Premenstrual tension syndrome 11/13/2017  . Urinary incontinence 11/13/2017  . Calculus of kidney 11/05/2017  . Endometriosis 11/05/2017  . Seizure (HCC) 11/05/2017  . Primary fibromyalgia syndrome 11/05/2017  . Intractable migraine with aura without status migrainosus 06/06/2017  . Anxiety 01/09/2017  . Headache 12/12/2016  . Avascular necrosis of bones of both hips (HCC) 11/19/2014  . Fibrositis 02/28/2014  . Headache, migraine 02/28/2014  . Fibromyalgia 02/28/2014  . Migraines 02/28/2014    Past Surgical History:  Procedure Laterality Date  . APPENDECTOMY    . CHOLECYSTECTOMY    . VAGINAL HYSTERECTOMY       OB History    Gravida      Para      Term      Preterm        AB      Living  2     SAB      TAB      Ectopic      Multiple      Live Births               Home Medications    Prior to Admission medications   Medication Sig Start Date End Date Taking? Authorizing Provider  Ascorbic Acid (VITAMIN C PO) Take 1 tablet by mouth daily.    [provider]  Cholecalciferol (VITAMIN D PO) Take 1 tablet by mouth daily.    [provider]  halobetasol (ULTRAVATE) 0.05 % cream Apply topically 2 (two) times daily. Patient taking differently: Apply 1 application topically 2 (two) times daily as needed (itching).  04/21/15   Johna Sheriff, MD  Magnesium 250 MG TABS Take 500 mg by mouth 2 (two) times daily.    [provider]  SUMAtriptan (IMITREX) 100 MG tablet TAKE 1/2 TABLET AS NEEDED FOR MIGRAINE OR HEADACHE MAY REPEAT IN 2 HOURS IF HEADACHE PERSISTS 08/06/16   Mechele Claude, MD    Family History Family History  Problem Relation Age of Onset  . Pancreatic cancer Father   . Diabetes Mellitus II Father   .  Heart disease Father   . Breast cancer Maternal Grandmother     Social History Social History   Tobacco Use  . Smoking status: Never Smoker  . Smokeless tobacco: Never Used  Substance Use Topics  . Alcohol use: Yes    Comment: Occasional  . Drug use: No     Allergies   Codeine; Hydrocodone; Latex; and Morphine and related   Review of Systems Review of Systems  Constitutional: Positive for diaphoresis and fatigue. Negative for fever.  HENT: Positive for sore throat and trouble swallowing. Negative for congestion, ear pain and rhinorrhea.   Respiratory: Positive for cough.      Physical Exam Updated Vital Signs BP (!) 144/84 (BP Location: Right Arm)   Pulse 87   Temp 97.7 F (36.5 C) (Oral)   Resp 20   Ht 5\' 3"  (1.6 m)   Wt 65.8 kg   SpO2 100%   BMI 25.69 kg/m   Physical Exam  Constitutional: She is oriented to person, place, and time. She appears well-developed and  well-nourished. No distress.  HENT:  Head: Normocephalic and atraumatic.  Right Ear: Hearing, tympanic membrane, external ear and ear canal normal.  Left Ear: Hearing, tympanic membrane, external ear and ear canal normal.  Nose: No mucosal edema.  Mouth/Throat: Uvula is midline, oropharynx is clear and moist and mucous membranes are normal.  Eyes: Pupils are equal, round, and reactive to light. Conjunctivae are normal. Right eye exhibits no discharge. Left eye exhibits no discharge. No scleral icterus.  Neck: Normal range of motion. No thyroid mass present.  Tenderness over right anterior cervical lymph node chain which do not feel particularly swollen  Cardiovascular: Normal rate and regular rhythm.  Pulmonary/Chest: Effort normal and breath sounds normal. No respiratory distress.  Abdominal: She exhibits no distension.  Neurological: She is alert and oriented to person, place, and time.  Skin: Skin is warm and dry.  Psychiatric: She has a normal mood and affect. Her behavior is normal.  Nursing note and vitals reviewed.    ED Treatments / Results  Labs (all labs ordered are listed, but only abnormal results are displayed) Labs Reviewed  GROUP A STREP BY PCR    EKG None  Radiology Dg Chest 2 View  Result Date: 01/12/2018 CLINICAL DATA:  Cough EXAM: CHEST - 2 VIEW COMPARISON:  11/03/2017 FINDINGS: The heart size and mediastinal contours are within normal limits. Both lungs are clear. The visualized skeletal structures are unremarkable. IMPRESSION: No active cardiopulmonary disease. Electronically Signed   By: Jasmine Pang M.D.   On: 01/12/2018 23:45    Procedures Procedures (including critical care time)  Medications Ordered in ED Medications - No data to display   Initial Impression / Assessment and Plan / ED Course  I have reviewed the triage vital signs and the nursing notes.  Pertinent labs & imaging results that were available during my care of the patient were  reviewed by me and considered in my medical decision making (see chart for details).  42 year old female presents with right anterior neck pain and a cough for the past week and a half. No obvious signs of infection on exam. Vitals are normal. She is very worried about more atypical causes of her symptoms such as cancer or thyroid issues. We obtained a strep test and a CXR which were negative. I think we can treat her for infectious symptoms with prednisone and Z-pack and see if she improves. She was advised to closely follow with  her PCP if she doesn't have any improvement after she finishes these medicines. She verbalized understanding.  Final Clinical Impressions(s) / ED Diagnoses   Final diagnoses:  Acute neck pain  Cough    ED Discharge Orders    None       Bethel Born, PA-C 01/13/18 1635    Devoria Albe, MD 01/13/18 2300

## 2018-01-13 LAB — GROUP A STREP BY PCR: Group A Strep by PCR: NOT DETECTED

## 2018-01-13 MED ORDER — AZITHROMYCIN 250 MG PO TABS: 250.0000 mg | ORAL_TABLET | Freq: Every day | ORAL | 0 refills | Status: AC

## 2018-01-13 MED ORDER — PREDNISONE 20 MG PO TABS: 40.0000 mg | ORAL_TABLET | Freq: Every day | ORAL | 0 refills | Status: AC

## 2018-01-13 NOTE — Discharge Instructions (Signed)
Take Z-pack for possible infection Take steroid for throat swelling Follow up with your doctor if you are not improving

## 2018-01-13 NOTE — ED Notes (Signed)
Pt and family updated on plan of care, denies any needs at present time,  

## 2018-01-13 NOTE — ED Notes (Signed)
ED Provider at bedside. 

## 2018-01-16 ENCOUNTER — Ambulatory Visit (HOSPITAL_COMMUNITY): Admission: RE | Admit: 2018-01-16 | Payer: BLUE CROSS/BLUE SHIELD | Source: Ambulatory Visit

## 2018-01-26 ENCOUNTER — Ambulatory Visit (HOSPITAL_COMMUNITY): Admission: RE | Admit: 2018-01-26 | Payer: BLUE CROSS/BLUE SHIELD | Source: Ambulatory Visit

## 2018-02-12 ENCOUNTER — Other Ambulatory Visit (HOSPITAL_COMMUNITY): Payer: BLUE CROSS/BLUE SHIELD

## 2018-02-20 ENCOUNTER — Other Ambulatory Visit (HOSPITAL_COMMUNITY): Payer: BLUE CROSS/BLUE SHIELD

## 2018-04-08 ENCOUNTER — Other Ambulatory Visit: Payer: Self-pay | Admitting: Family Medicine

## 2018-04-08 ENCOUNTER — Other Ambulatory Visit: Payer: Self-pay | Admitting: Obstetrics and Gynecology

## 2018-04-08 DIAGNOSIS — Z1231 Encounter for screening mammogram for malignant neoplasm of breast: Secondary | ICD-10-CM

## 2018-04-17 ENCOUNTER — Ambulatory Visit: Payer: BLUE CROSS/BLUE SHIELD

## 2018-04-29 ENCOUNTER — Ambulatory Visit: Payer: BLUE CROSS/BLUE SHIELD | Admitting: Orthopedic Surgery

## 2018-05-01 ENCOUNTER — Inpatient Hospital Stay (HOSPITAL_COMMUNITY): Admission: RE | Admit: 2018-05-01 | Payer: BLUE CROSS/BLUE SHIELD | Source: Ambulatory Visit

## 2018-05-13 ENCOUNTER — Ambulatory Visit: Payer: BLUE CROSS/BLUE SHIELD | Admitting: Orthopedic Surgery

## 2018-06-01 ENCOUNTER — Ambulatory Visit: Payer: BLUE CROSS/BLUE SHIELD | Admitting: Orthopedic Surgery

## 2018-11-23 ENCOUNTER — Other Ambulatory Visit: Payer: Self-pay | Admitting: Neurological Surgery

## 2018-11-23 DIAGNOSIS — M461 Sacroiliitis, not elsewhere classified: Secondary | ICD-10-CM

## 2018-12-04 ENCOUNTER — Ambulatory Visit
Admission: RE | Admit: 2018-12-04 | Discharge: 2018-12-04 | Disposition: A | Discharge status: Home / Self Care | Payer: BC Managed Care – PPO | Source: Ambulatory Visit | Attending: Neurological Surgery | Admitting: Neurological Surgery

## 2018-12-04 ENCOUNTER — Other Ambulatory Visit: Payer: Self-pay

## 2018-12-04 DIAGNOSIS — M461 Sacroiliitis, not elsewhere classified: Secondary | ICD-10-CM

## 2018-12-04 MED ORDER — METHYLPREDNISOLONE ACETATE 40 MG/ML INJ SUSP (RADIOLOG
120.0000 mg | Freq: Once | INTRAMUSCULAR | Status: AC
  Administered 2018-12-04: 120 mg via INTRA_ARTICULAR

## 2018-12-04 MED ORDER — IOPAMIDOL (ISOVUE-M 200) INJECTION 41%
1.0000 mL | Freq: Once | INTRAMUSCULAR | Status: AC
  Administered 2018-12-04: 1 mL via INTRA_ARTICULAR

## 2018-12-24 ENCOUNTER — Other Ambulatory Visit: Payer: Self-pay

## 2018-12-24 ENCOUNTER — Ambulatory Visit
Admission: RE | Admit: 2018-12-24 | Discharge: 2018-12-24 | Disposition: A | Discharge status: Home / Self Care | Payer: BC Managed Care – PPO | Source: Ambulatory Visit | Attending: Family Medicine | Admitting: Family Medicine

## 2018-12-24 DIAGNOSIS — Z1231 Encounter for screening mammogram for malignant neoplasm of breast: Secondary | ICD-10-CM

## 2019-09-07 ENCOUNTER — Other Ambulatory Visit: Payer: Self-pay | Admitting: Orthopedic Surgery

## 2019-09-07 DIAGNOSIS — G8929 Other chronic pain: Secondary | ICD-10-CM

## 2019-09-07 DIAGNOSIS — M545 Low back pain, unspecified: Secondary | ICD-10-CM

## 2019-09-17 ENCOUNTER — Other Ambulatory Visit: Payer: BC Managed Care – PPO

## 2019-09-24 ENCOUNTER — Ambulatory Visit
Admission: RE | Admit: 2019-09-24 | Discharge: 2019-09-24 | Disposition: A | Discharge status: Home / Self Care | Payer: BC Managed Care – PPO | Source: Ambulatory Visit | Attending: Orthopedic Surgery | Admitting: Orthopedic Surgery

## 2019-09-24 ENCOUNTER — Other Ambulatory Visit: Payer: Self-pay

## 2019-09-24 DIAGNOSIS — G8929 Other chronic pain: Secondary | ICD-10-CM

## 2019-09-24 DIAGNOSIS — M545 Low back pain, unspecified: Secondary | ICD-10-CM

## 2019-09-24 MED ORDER — IOPAMIDOL (ISOVUE-M 200) INJECTION 41%
1.0000 mL | Freq: Once | INTRAMUSCULAR | Status: AC
  Administered 2019-09-24: 1 mL via INTRA_ARTICULAR

## 2019-09-24 MED ORDER — METHYLPREDNISOLONE ACETATE 40 MG/ML INJ SUSP (RADIOLOG
120.0000 mg | Freq: Once | INTRAMUSCULAR | Status: AC
  Administered 2019-09-24: 120 mg via INTRA_ARTICULAR

## 2019-10-08 ENCOUNTER — Ambulatory Visit: Payer: BC Managed Care – PPO | Admitting: Cardiology

## 2019-11-16 ENCOUNTER — Other Ambulatory Visit: Payer: Self-pay | Admitting: Family Medicine

## 2019-11-16 DIAGNOSIS — Z1231 Encounter for screening mammogram for malignant neoplasm of breast: Secondary | ICD-10-CM

## 2019-12-27 ENCOUNTER — Other Ambulatory Visit: Payer: Self-pay

## 2019-12-27 ENCOUNTER — Ambulatory Visit
Admission: RE | Admit: 2019-12-27 | Discharge: 2019-12-27 | Disposition: A | Discharge status: Home / Self Care | Payer: BC Managed Care – PPO | Source: Ambulatory Visit | Attending: Family Medicine | Admitting: Family Medicine

## 2019-12-27 DIAGNOSIS — Z1231 Encounter for screening mammogram for malignant neoplasm of breast: Secondary | ICD-10-CM

## 2020-01-24 ENCOUNTER — Other Ambulatory Visit: Payer: Self-pay | Admitting: Orthopedic Surgery

## 2020-01-24 DIAGNOSIS — M545 Low back pain, unspecified: Secondary | ICD-10-CM

## 2020-01-24 DIAGNOSIS — G8929 Other chronic pain: Secondary | ICD-10-CM

## 2020-01-28 ENCOUNTER — Other Ambulatory Visit: Payer: BC Managed Care – PPO

## 2020-02-02 ENCOUNTER — Other Ambulatory Visit: Payer: BC Managed Care – PPO

## 2020-04-14 ENCOUNTER — Other Ambulatory Visit: Payer: Self-pay

## 2020-04-14 ENCOUNTER — Emergency Department (HOSPITAL_BASED_OUTPATIENT_CLINIC_OR_DEPARTMENT_OTHER)
Admission: EM | Admit: 2020-04-14 | Discharge: 2020-04-15 | Disposition: A | Discharge status: Home / Self Care | Payer: BC Managed Care – PPO | Attending: Emergency Medicine | Admitting: Emergency Medicine

## 2020-04-14 ENCOUNTER — Encounter (HOSPITAL_BASED_OUTPATIENT_CLINIC_OR_DEPARTMENT_OTHER): Payer: Self-pay | Admitting: Emergency Medicine

## 2020-04-14 DIAGNOSIS — Z91040 Latex allergy status: Secondary | ICD-10-CM | POA: Insufficient documentation

## 2020-04-14 DIAGNOSIS — M549 Dorsalgia, unspecified: Secondary | ICD-10-CM | POA: Diagnosis present

## 2020-04-14 DIAGNOSIS — M5431 Sciatica, right side: Secondary | ICD-10-CM | POA: Diagnosis not present

## 2020-04-14 LAB — CBC WITH DIFFERENTIAL/PLATELET
Abs Immature Granulocytes: 0.04 10*3/uL (ref 0.00–0.07)
Basophils Absolute: 0.1 10*3/uL (ref 0.0–0.1)
Basophils Relative: 1 %
Eosinophils Absolute: 0.2 10*3/uL (ref 0.0–0.5)
Eosinophils Relative: 2 %
HCT: 39.4 % (ref 36.0–46.0)
Hemoglobin: 13.4 g/dL (ref 12.0–15.0)
Immature Granulocytes: 0 %
Lymphocytes Relative: 22 %
Lymphs Abs: 2.2 10*3/uL (ref 0.7–4.0)
MCH: 30.8 pg (ref 26.0–34.0)
MCHC: 34 g/dL (ref 30.0–36.0)
MCV: 90.6 fL (ref 80.0–100.0)
Monocytes Absolute: 0.7 10*3/uL (ref 0.1–1.0)
Monocytes Relative: 7 %
Neutro Abs: 6.8 10*3/uL (ref 1.7–7.7)
Neutrophils Relative %: 68 %
Platelets: 282 10*3/uL (ref 150–400)
RBC: 4.35 MIL/uL (ref 3.87–5.11)
RDW: 12.2 % (ref 11.5–15.5)
WBC: 9.9 10*3/uL (ref 4.0–10.5)
nRBC: 0 % (ref 0.0–0.2)

## 2020-04-14 LAB — URINALYSIS, ROUTINE W REFLEX MICROSCOPIC
Bilirubin Urine: NEGATIVE
Glucose, UA: NEGATIVE mg/dL
Hgb urine dipstick: NEGATIVE
Ketones, ur: NEGATIVE mg/dL
Leukocytes,Ua: NEGATIVE
Nitrite: NEGATIVE
Protein, ur: NEGATIVE mg/dL
Specific Gravity, Urine: 1.01 (ref 1.005–1.030)
pH: 7 (ref 5.0–8.0)

## 2020-04-14 LAB — BASIC METABOLIC PANEL
Anion gap: 10 (ref 5–15)
BUN: 8 mg/dL (ref 6–20)
CO2: 22 mmol/L (ref 22–32)
Calcium: 8.9 mg/dL (ref 8.9–10.3)
Chloride: 103 mmol/L (ref 98–111)
Creatinine, Ser: 0.67 mg/dL (ref 0.44–1.00)
GFR, Estimated: >60 mL/min (ref >60)
Glucose, Bld: 123 mg/dL — ABNORMAL HIGH (ref 70–99)
Potassium: 3.9 mmol/L (ref 3.5–5.1)
Sodium: 135 mmol/L (ref 135–145)

## 2020-04-14 MED ORDER — KETOROLAC TROMETHAMINE 30 MG/ML IJ SOLN
30.0000 mg | Freq: Once | INTRAMUSCULAR | Status: AC
  Administered 2020-04-14: 30 mg via INTRAVENOUS
  Filled 2020-04-14: qty 1

## 2020-04-14 NOTE — ED Triage Notes (Signed)
Patient presents with complaints of right side lower back pain; states seen by urgent care and ortho today and sent here for MRI; states had toradol injection earlier today with no relief. States has numbness in groin and radiating pain to right lower extremity. Patient ambulatory with assistance to triage.

## 2020-04-14 NOTE — ED Notes (Signed)
[facility] at Bedside; Report given to same. Pt signed transfer consent willingly and all documentation provided to [facility].

## 2020-04-14 NOTE — ED Triage Notes (Signed)
States had lose of bowels today x 1.

## 2020-04-14 NOTE — ED Provider Notes (Signed)
[facility] EMERGENCY DEPARTMENT Provider Note  CSN: [id number] Arrival date & time: 04/14/20 1913    History Chief Complaint  Patient presents with  . Back Pain    HPI  [name] is a 45 y.o. female with history of AVN of hip and sacroilitis reports 2 days of R sided back pain, radiating down her R leg, described as a burning pain down to her pinky toe. Worse with movement. Today she woke up with severe pain and had defecated on herself. She has had some numbness in her groin area as well. She went to UC today and was given a dose of Toradol which has worked well for her in the past but today did not help. She was advised by that provider to go to ED for MRI due to concerns of cauda equina syndrome. Instead, she went to her ortho office and was sent to this ED for an MRI for the same reason. She has continued to have loose stools today but no further incontinence. No urinary retention or incontinence. No fever, no history of IVDA.    Past Medical History:  Diagnosis Date  . Anxiety   . Arthritis   . Chronic pain   . Endometriosis   . Fibromyalgia   . History of seizures    Reportedly related to migraine headaches  . Kidney stone   . Migraine headache     Past Surgical History:  Procedure Laterality Date  . APPENDECTOMY    . CHOLECYSTECTOMY    . VAGINAL HYSTERECTOMY      Family History  Problem Relation Age of Onset  . Pancreatic cancer Father   . Diabetes Mellitus II Father   . Heart disease Father   . Breast cancer Maternal Grandmother     Social History   Tobacco Use  . Smoking status: Never Smoker  . Smokeless tobacco: Never Used  Vaping Use  . Vaping Use: Never used  Substance Use Topics  . Alcohol use: Yes    Comment: Occasional  . Drug use: No     Home Medications Prior to Admission medications   Medication Sig Start Date End Date Taking? Authorizing Provider  celecoxib (CELEBREX) 200 MG capsule  04/04/20  Yes [provider]  cyclobenzaprine (FLEXERIL) 10 MG tablet Take by mouth. 09/19/18  Yes [provider]  tiZANidine (ZANAFLEX) 4 MG tablet Take by mouth. 04/14/20 04/14/21 Yes [provider]  Ascorbic Acid (VITAMIN C PO) Take 1 tablet by mouth daily.    [provider]  azithromycin (ZITHROMAX) 250 MG tablet Take 1 tablet (250 mg total) by mouth daily. Take first 2 tablets together, then 1 every day until finished. 01/13/18   Bethel Born, PA-C  Cholecalciferol (VITAMIN D PO) Take 1 tablet by mouth daily.    [provider]  gabapentin (NEURONTIN) 100 MG capsule Take by mouth.    [provider]  halobetasol (ULTRAVATE) 0.05 % cream Apply topically 2 (two) times daily. Patient taking differently: Apply 1 application topically 2 (two) times daily as needed (itching).  04/21/15   Johna Sheriff, MD  Magnesium 250 MG TABS Take 500 mg by mouth 2 (two) times daily.    [provider]  predniSONE (DELTASONE) 20 MG tablet Take 2 tablets (40 mg total) by mouth daily. 01/13/18   Bethel Born, PA-C  sertraline (ZOLOFT) 50 MG tablet Take by mouth. 10/27/19   [provider]  SUMAtriptan (IMITREX) 100 MG tablet TAKE  1/2 TABLET AS NEEDED FOR MIGRAINE OR HEADACHE MAY REPEAT IN 2 HOURS IF HEADACHE PERSISTS 08/06/16   Mechele Claude, MD     Allergies    Codeine, Hydrocodone, Latex, and Morphine and related   Review of Systems   Review of Systems A comprehensive review of systems was completed and negative except as noted in HPI.    Physical Exam BP 131/89 (BP Location: Right Arm)   Pulse 77   Temp 98.1 F (36.7 C) (Oral)   Resp 18   Wt 65.8 kg   SpO2 100%   BMI 25.70 kg/m   Physical Exam Vitals and nursing note reviewed.  Constitutional:      Appearance: Normal appearance.  HENT:     Head: Normocephalic and atraumatic.     Nose: Nose normal.     Mouth/Throat:     Mouth: Mucous membranes are moist.  Eyes:     Extraocular  Movements: Extraocular movements intact.     Conjunctiva/sclera: Conjunctivae normal.  Cardiovascular:     Rate and Rhythm: Normal rate.  Pulmonary:     Effort: Pulmonary effort is normal.     Breath sounds: Normal breath sounds.  Abdominal:     General: Abdomen is flat.     Palpations: Abdomen is soft.     Tenderness: There is no abdominal tenderness.  Musculoskeletal:        General: No swelling. Normal range of motion.     Cervical back: Neck supple.     Comments: Tender to palpation R sciatic notch; positive contralateral and ipsilateral straight leg raise  Skin:    General: Skin is warm and dry.  Neurological:     General: No focal deficit present.     Mental Status: She is alert.     Comments: Normal strength in LE to plantar and dorsiflexion, normal DTRs, normal sensation to LE, mild decreased sensation of buttocks area  Psychiatric:        Mood and Affect: Mood normal.      ED Results / Procedures / Treatments   Labs (all labs ordered are listed, but only abnormal results are displayed) Labs Reviewed - No data to display  EKG None   Radiology No results found.  Procedures Procedures  Medications Ordered in the ED Medications  ketorolac (TORADOL) 30 MG/ML injection 30 mg (has no administration in time range)     MDM Rules/Calculators/A&P MDM Patient with symptoms of R sided sciatica, atraumatic with reported fecal incontinence today. Advised by two other providers to come to ED for MRI, unfortunately we do not have that machine available today. She has multiple prior reactions to Opiates and would like to avoid if possible. Will give another dose of Toradol given that it has been ~8hrs since that dose. Spoke with Dr. Particia Nearing at [facility] ED who will accept the patient in transfer for MRI. [facility] aware.   ED Course  I have reviewed the triage vital signs and the nursing notes.  Pertinent labs & imaging results that were available during my care of the  patient were reviewed by me and considered in my medical decision making (see chart for details).  Clinical Course as of 04/14/20 2149  Fri Apr 14, 2020  2149 CBC, CMP and UA normal.  [CS]    Clinical Course User Index [CS] Pollyann Savoy, MD    Final Clinical Impression(s) / ED Diagnoses Final diagnoses:  Right sided sciatica    Rx / DC Orders ED Discharge Orders  None       Pollyann Savoy, MD 04/14/20 2137

## 2020-04-14 NOTE — ED Notes (Signed)
Care Handoff and Report given to [facility]

## 2020-04-14 NOTE — ED Notes (Signed)
Care Handoff/Report given to [facility], Consulting civil engineer. All questions answered.

## 2020-04-14 NOTE — ED Triage Notes (Signed)
Pt present to ED BIB [facility] from [facility]. Pt sent here for MRI of back. C/o back pain x2d, loss bowel control during sleep today. Pt given Toradol IV at [facility]

## 2020-04-15 ENCOUNTER — Emergency Department (HOSPITAL_COMMUNITY): Payer: BC Managed Care – PPO

## 2020-04-15 MED ORDER — LIDOCAINE 5 % EX PTCH: 1.0000 | MEDICATED_PATCH | CUTANEOUS | 0 refills | Status: DC

## 2020-04-15 MED ORDER — OXYCODONE-ACETAMINOPHEN 5-325 MG PO TABS
1.0000 | ORAL_TABLET | ORAL | 0 refills | Status: AC | PRN
Start: 2020-04-15 — End: 2020-04-18

## 2020-04-15 MED ORDER — OXYCODONE-ACETAMINOPHEN 5-325 MG PO TABS: 1.0000 | ORAL_TABLET | ORAL | Status: DC | PRN

## 2020-04-15 MED ORDER — LIDOCAINE 5 % EX PTCH: 1.0000 | MEDICATED_PATCH | CUTANEOUS | 0 refills | Status: AC

## 2020-04-15 MED ORDER — FENTANYL CITRATE (PF) 100 MCG/2ML IJ SOLN
25.0000 ug | Freq: Once | INTRAMUSCULAR | Status: AC
  Administered 2020-04-15: 25 ug via INTRAMUSCULAR
  Filled 2020-04-15: qty 2

## 2020-04-15 MED ORDER — IBUPROFEN 400 MG PO TABS
400.0000 mg | ORAL_TABLET | Freq: Once | ORAL | Status: AC
  Administered 2020-04-15: 400 mg via ORAL

## 2020-04-15 MED ORDER — OXYCODONE-ACETAMINOPHEN 5-325 MG PO TABS: 1.0000 | ORAL_TABLET | ORAL | 0 refills | Status: DC | PRN

## 2020-04-15 NOTE — Discharge Instructions (Addendum)
Your MRI showed chronic changes in your low back, however it ruled out life-threatening causes of your back pain.  Please make sure to follow-up with Dr. Mattie Marlin office for further evaluation and treatment.  Please take the Percocet as needed for pain.  Return to the ER for any new or worsening symptoms

## 2020-04-15 NOTE — ED Provider Notes (Signed)
Patient is received from attending [facility] complaining of low back pain and one episode of loss of bowel control in her sleep today.  She was sent here for an MRI to rule out cauda equina.  Patient still endorsing no additional loss of bowel bladder control.  I personally reviewed her MRI with Dr. Lupita Dawn, there is some chronic disc degeneration at L4-5.  No distinct neurocompression noted.  There is some fluid intensity material between the spinous processes, which could indicate abutment.  Reviewed the findings with Dr. Lupita Dawn with neurosurgery who does not feel that she has any emergent condition.  I had a long discussion with the patient, she states that she has had some adverse reactions to opioids in the past.  She has had codeine which she states she responded poorly to.  She states that she has had Percocet in the past and has been okay with it.  She also states that she follows with Dr. 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  She states that she has taken gabapentin before with little relief.  I will defer starting any other neuropathic medications to neurosurgery at this time.  She is comfortable with taking Percocet at home, will prescribe Lidoderm patches.  She was given follow-up with Dr. Lupita Dawn.  We discussed return precautions.  Low suspicion for septic arthritis at this time.  Stable for discharge at this time.  BP (!) 130/91 (BP Location: Right Arm)   Pulse 82   Temp 97.9 F (36.6 C) (Oral)   Resp 18   Wt 65.8 kg   SpO2 99%   BMI 25.70 kg/m   ED Course/Procedures   Clinical Course as of 04/15/20 1815  Fri Apr 14, 2020  2149 CBC, CMP and UA normal.  [CS]    Clinical Course User Index [CS] Pollyann Savoy, MD   Results for orders placed or performed during the hospital encounter of 04/14/20  Basic metabolic panel  Result Value Ref Range   Sodium 135 135 - 145 mmol/L   Potassium 3.9 3.5 - 5.1  mmol/L   Chloride 103 98 - 111 mmol/L   CO2 22 22 - 32 mmol/L   Glucose, Bld 123 (H) 70 - 99 mg/dL   BUN 8 6 - 20 mg/dL   Creatinine, Ser 4.81 0.44 - 1.00 mg/dL   Calcium 8.9 8.9 - 85.6 mg/dL   GFR, Estimated >31 >49 mL/min   Anion gap 10 5 - 15  CBC with Differential  Result Value Ref Range   WBC 9.9 4.0 - 10.5 K/uL   RBC 4.35 3.87 - 5.11 MIL/uL   Hemoglobin 13.4 12.0 - 15.0 g/dL   HCT 70.2 63.7 - 85.8 %   MCV 90.6 80.0 - 100.0 fL   MCH 30.8 26.0 - 34.0 pg   MCHC 34.0 30.0 - 36.0 g/dL   RDW 85.0 27.7 - 41.2 %   Platelets 282 150 - 400 K/uL   nRBC 0.0 0.0 - 0.2 %   Neutrophils Relative % 68 %   Neutro Abs 6.8 1.7 - 7.7 K/uL   Lymphocytes Relative 22 %   Lymphs Abs 2.2 0.7 - 4.0 K/uL   Monocytes Relative 7 %   Monocytes Absolute 0.7 0.1 - 1.0 K/uL   Eosinophils Relative 2 %   Eosinophils Absolute 0.2 0.0 - 0.5 K/uL   Basophils Relative 1 %   Basophils Absolute 0.1 0.0 - 0.1  K/uL   Immature Granulocytes 0 %   Abs Immature Granulocytes 0.04 0.00 - 0.07 K/uL  Urinalysis, Routine w reflex microscopic Urine, Clean Catch  Result Value Ref Range   Color, Urine YELLOW YELLOW   APPearance CLEAR CLEAR   Specific Gravity, Urine 1.010 1.005 - 1.030   pH 7.0 5.0 - 8.0   Glucose, UA NEGATIVE NEGATIVE mg/dL   Hgb urine dipstick NEGATIVE NEGATIVE   Bilirubin Urine NEGATIVE NEGATIVE   Ketones, ur NEGATIVE NEGATIVE mg/dL   Protein, ur NEGATIVE NEGATIVE mg/dL   Nitrite NEGATIVE NEGATIVE   Leukocytes,Ua NEGATIVE NEGATIVE   MR LUMBAR SPINE WO CONTRAST  Result Date: 04/15/2020 CLINICAL DATA:  Low back pain worse on the right, radiating to the right leg.Loss of bowel control. Question neural compression. EXAM: MRI LUMBAR SPINE WITHOUT CONTRAST TECHNIQUE: Multiplanar, multisequence MR imaging of the lumbar spine was performed. No intravenous contrast was administered. COMPARISON:  02/11/2007 FINDINGS: Segmentation:  5 lumbar type vertebral bodies. Alignment:  Normal Vertebrae:  Normal Conus  medullaris and cauda equina: Conus extends to the L1 level. Conus and cauda equina appear normal. Paraspinal and other soft tissues: Normal Disc levels: No abnormality at L3-4 or above. The discs are normal. No stenosis. L4-5: Chronic disc degeneration with shallow disc protrusion. This indents the thecal sac. Mild stenosis of both lateral recesses but without definite neural compression. Findings are fairly similar to the study 2008. At that time, stenosis was more severe in the right lateral recess than the left. L5-S1: Normal interspace.  No stenosis. Patient does have some fluid intensity material between the spinous processes at L3-4, L4-5 and L5-S1, which could possibly indicate abutment, which can be associated with low back pain. IMPRESSION: 1. At L4-5, there is chronic disc degeneration with a shallow disc protrusion. This indents the thecal sac. There is mild stenosis of both lateral recesses but no distinct neural compression. Findings at this level are fairly similar to the study of 2008. 2. Patient does have some fluid intensity material between the spinous processes at L3-4, L4-5 and L5-S1, which could possibly indicate abutment, which can be associated with low back pain. Electronically Signed   By: Paulina Fusi M.D.   On: 04/15/2020 01:41    Procedures  MDM       Mare Ferrari, PA-C 04/15/20 1815  Gwyneth Sprout, MD 04/16/20 2055

## 2020-04-15 NOTE — ED Notes (Signed)
Discharge instructions discussed with pt. Pt verbalized understanding. Pt stable and ambulatory. No signature pad available. 

## 2020-04-24 ENCOUNTER — Other Ambulatory Visit: Payer: Self-pay | Admitting: Neurological Surgery

## 2020-04-24 DIAGNOSIS — M461 Sacroiliitis, not elsewhere classified: Secondary | ICD-10-CM

## 2020-04-28 ENCOUNTER — Other Ambulatory Visit: Payer: Self-pay

## 2020-04-28 ENCOUNTER — Ambulatory Visit
Admission: RE | Admit: 2020-04-28 | Discharge: 2020-04-28 | Disposition: A | Discharge status: Home / Self Care | Payer: BC Managed Care – PPO | Source: Ambulatory Visit | Attending: Neurological Surgery | Admitting: Neurological Surgery

## 2020-04-28 DIAGNOSIS — M461 Sacroiliitis, not elsewhere classified: Secondary | ICD-10-CM

## 2020-04-28 MED ORDER — METHYLPREDNISOLONE ACETATE 40 MG/ML INJ SUSP (RADIOLOG
120.0000 mg | Freq: Once | INTRAMUSCULAR | Status: AC
  Administered 2020-04-28: 120 mg via INTRA_ARTICULAR

## 2020-04-28 MED ORDER — IOPAMIDOL (ISOVUE-M 200) INJECTION 41%
1.0000 mL | Freq: Once | INTRAMUSCULAR | Status: AC
  Administered 2020-04-28: 1 mL via INTRA_ARTICULAR

## 2020-04-28 NOTE — Discharge Instructions (Signed)

## 2020-09-26 ENCOUNTER — Other Ambulatory Visit: Payer: Self-pay | Admitting: Neurological Surgery

## 2020-09-26 ENCOUNTER — Ambulatory Visit
Admission: RE | Admit: 2020-09-26 | Discharge: 2020-09-26 | Disposition: A | Discharge status: Home / Self Care | Payer: BC Managed Care – PPO | Source: Ambulatory Visit | Attending: Neurological Surgery | Admitting: Neurological Surgery

## 2020-09-26 ENCOUNTER — Other Ambulatory Visit: Payer: Self-pay

## 2020-09-26 DIAGNOSIS — M461 Sacroiliitis, not elsewhere classified: Secondary | ICD-10-CM

## 2020-09-26 MED ORDER — METHYLPREDNISOLONE ACETATE 40 MG/ML INJ SUSP (RADIOLOG
120.0000 mg | Freq: Once | INTRAMUSCULAR | Status: AC
  Administered 2020-09-26: 120 mg via INTRA_ARTICULAR

## 2020-09-26 MED ORDER — IOPAMIDOL (ISOVUE-M 200) INJECTION 41%
1.0000 mL | Freq: Once | INTRAMUSCULAR | Status: AC
  Administered 2020-09-26: 1 mL via INTRA_ARTICULAR

## 2020-09-26 NOTE — Discharge Instructions (Signed)

## 2020-10-24 ENCOUNTER — Other Ambulatory Visit (HOSPITAL_COMMUNITY): Payer: Self-pay | Admitting: Orthopedic Surgery

## 2020-10-24 ENCOUNTER — Other Ambulatory Visit: Payer: Self-pay | Admitting: Orthopedic Surgery

## 2020-10-24 DIAGNOSIS — M25551 Pain in right hip: Secondary | ICD-10-CM

## 2020-11-06 ENCOUNTER — Ambulatory Visit (HOSPITAL_COMMUNITY)
Admission: RE | Admit: 2020-11-06 | Discharge: 2020-11-06 | Disposition: A | Discharge status: Home / Self Care | Payer: No Typology Code available for payment source | Source: Ambulatory Visit | Attending: Orthopedic Surgery | Admitting: Orthopedic Surgery

## 2020-11-06 ENCOUNTER — Other Ambulatory Visit: Payer: Self-pay

## 2020-11-06 DIAGNOSIS — M25551 Pain in right hip: Secondary | ICD-10-CM | POA: Insufficient documentation

## 2020-11-09 ENCOUNTER — Other Ambulatory Visit: Payer: Self-pay | Admitting: Orthopedic Surgery

## 2020-11-09 DIAGNOSIS — M25551 Pain in right hip: Secondary | ICD-10-CM

## 2020-11-21 ENCOUNTER — Ambulatory Visit
Admission: RE | Admit: 2020-11-21 | Discharge: 2020-11-21 | Disposition: A | Discharge status: Home / Self Care | Payer: No Typology Code available for payment source | Source: Ambulatory Visit | Attending: Orthopedic Surgery | Admitting: Orthopedic Surgery

## 2020-11-21 ENCOUNTER — Other Ambulatory Visit: Payer: Self-pay

## 2020-11-21 DIAGNOSIS — M25551 Pain in right hip: Secondary | ICD-10-CM

## 2020-11-21 MED ORDER — METHYLPREDNISOLONE ACETATE 40 MG/ML INJ SUSP (RADIOLOG: 80.0000 mg | Freq: Once | INTRAMUSCULAR | Status: DC

## 2020-11-21 MED ORDER — IOPAMIDOL (ISOVUE-M 200) INJECTION 41%: 1.0000 mL | Freq: Once | INTRAMUSCULAR | Status: DC

## 2020-11-24 ENCOUNTER — Other Ambulatory Visit: Payer: Self-pay | Admitting: Family Medicine

## 2020-11-24 DIAGNOSIS — Z1231 Encounter for screening mammogram for malignant neoplasm of breast: Secondary | ICD-10-CM

## 2021-01-01 ENCOUNTER — Ambulatory Visit: Payer: No Typology Code available for payment source

## 2021-01-10 ENCOUNTER — Other Ambulatory Visit: Payer: Self-pay | Admitting: Neurological Surgery

## 2021-01-11 ENCOUNTER — Other Ambulatory Visit: Payer: Self-pay | Admitting: Neurological Surgery

## 2021-01-11 DIAGNOSIS — M461 Sacroiliitis, not elsewhere classified: Secondary | ICD-10-CM

## 2021-01-12 ENCOUNTER — Other Ambulatory Visit: Payer: Self-pay | Admitting: Neurological Surgery

## 2021-01-12 DIAGNOSIS — M461 Sacroiliitis, not elsewhere classified: Secondary | ICD-10-CM

## 2021-01-16 ENCOUNTER — Ambulatory Visit
Admission: RE | Admit: 2021-01-16 | Discharge: 2021-01-16 | Disposition: A | Discharge status: Home / Self Care | Payer: No Typology Code available for payment source | Source: Ambulatory Visit | Attending: Neurological Surgery | Admitting: Neurological Surgery

## 2021-01-16 ENCOUNTER — Other Ambulatory Visit: Payer: Self-pay

## 2021-01-16 DIAGNOSIS — M461 Sacroiliitis, not elsewhere classified: Secondary | ICD-10-CM

## 2021-01-16 MED ORDER — METHYLPREDNISOLONE ACETATE 40 MG/ML INJ SUSP (RADIOLOG
80.0000 mg | Freq: Once | INTRAMUSCULAR | Status: AC
  Administered 2021-01-16: 80 mg via INTRA_ARTICULAR

## 2021-01-16 MED ORDER — IOPAMIDOL (ISOVUE-M 200) INJECTION 41%
1.0000 mL | Freq: Once | INTRAMUSCULAR | Status: AC
  Administered 2021-01-16: 1 mL via INTRA_ARTICULAR

## 2021-01-16 NOTE — Discharge Instructions (Signed)

## 2021-01-30 ENCOUNTER — Other Ambulatory Visit: Payer: Self-pay

## 2021-01-30 ENCOUNTER — Ambulatory Visit
Admission: RE | Admit: 2021-01-30 | Discharge: 2021-01-30 | Disposition: A | Discharge status: Home / Self Care | Payer: No Typology Code available for payment source | Source: Ambulatory Visit | Attending: Family Medicine | Admitting: Family Medicine

## 2021-01-30 DIAGNOSIS — Z1231 Encounter for screening mammogram for malignant neoplasm of breast: Secondary | ICD-10-CM

## 2021-04-09 ENCOUNTER — Other Ambulatory Visit (HOSPITAL_COMMUNITY): Payer: Self-pay | Admitting: Neurological Surgery

## 2021-04-09 ENCOUNTER — Other Ambulatory Visit: Payer: Self-pay | Admitting: Neurological Surgery

## 2021-04-09 DIAGNOSIS — M5416 Radiculopathy, lumbar region: Secondary | ICD-10-CM

## 2021-04-11 ENCOUNTER — Ambulatory Visit: Payer: No Typology Code available for payment source | Admitting: Urology

## 2021-04-13 ENCOUNTER — Other Ambulatory Visit: Payer: Self-pay | Admitting: Neurological Surgery

## 2021-04-13 DIAGNOSIS — M5416 Radiculopathy, lumbar region: Secondary | ICD-10-CM

## 2021-04-17 ENCOUNTER — Other Ambulatory Visit: Payer: Self-pay

## 2021-04-17 ENCOUNTER — Emergency Department (HOSPITAL_BASED_OUTPATIENT_CLINIC_OR_DEPARTMENT_OTHER)
Admission: EM | Admit: 2021-04-17 | Discharge: 2021-04-17 | Disposition: A | Discharge status: Home / Self Care | Payer: No Typology Code available for payment source | Attending: Emergency Medicine | Admitting: Emergency Medicine

## 2021-04-17 ENCOUNTER — Emergency Department (HOSPITAL_BASED_OUTPATIENT_CLINIC_OR_DEPARTMENT_OTHER): Payer: No Typology Code available for payment source

## 2021-04-17 ENCOUNTER — Encounter (HOSPITAL_BASED_OUTPATIENT_CLINIC_OR_DEPARTMENT_OTHER): Payer: Self-pay | Admitting: *Deleted

## 2021-04-17 DIAGNOSIS — R1013 Epigastric pain: Secondary | ICD-10-CM | POA: Diagnosis not present

## 2021-04-17 DIAGNOSIS — R1012 Left upper quadrant pain: Secondary | ICD-10-CM | POA: Diagnosis not present

## 2021-04-17 DIAGNOSIS — R1032 Left lower quadrant pain: Secondary | ICD-10-CM | POA: Insufficient documentation

## 2021-04-17 DIAGNOSIS — Z91040 Latex allergy status: Secondary | ICD-10-CM | POA: Insufficient documentation

## 2021-04-17 HISTORY — DX: Other chronic pain: G89.29

## 2021-04-17 LAB — COMPREHENSIVE METABOLIC PANEL
ALT: 22 U/L (ref 0–44)
AST: 17 U/L (ref 15–41)
Albumin: 4.1 g/dL (ref 3.5–5.0)
Alkaline Phosphatase: 62 U/L (ref 38–126)
Anion gap: 6 (ref 5–15)
BUN: 10 mg/dL (ref 6–20)
CO2: 27 mmol/L (ref 22–32)
Calcium: 9.3 mg/dL (ref 8.9–10.3)
Chloride: 103 mmol/L (ref 98–111)
Creatinine, Ser: 0.67 mg/dL (ref 0.44–1.00)
GFR, Estimated: >60 mL/min (ref >60)
Glucose, Bld: 86 mg/dL (ref 70–99)
Potassium: 4.6 mmol/L (ref 3.5–5.1)
Sodium: 136 mmol/L (ref 135–145)
Total Bilirubin: 0.4 mg/dL (ref 0.3–1.2)
Total Protein: 7.6 g/dL (ref 6.5–8.1)

## 2021-04-17 LAB — URINALYSIS, ROUTINE W REFLEX MICROSCOPIC
Bilirubin Urine: NEGATIVE
Glucose, UA: NEGATIVE mg/dL
Hgb urine dipstick: NEGATIVE
Ketones, ur: NEGATIVE mg/dL
Leukocytes,Ua: NEGATIVE
Nitrite: NEGATIVE
Protein, ur: NEGATIVE mg/dL
Specific Gravity, Urine: 1.015 (ref 1.005–1.030)
pH: 5 (ref 5.0–8.0)

## 2021-04-17 LAB — CBC
HCT: 42.3 % (ref 36.0–46.0)
Hemoglobin: 14.2 g/dL (ref 12.0–15.0)
MCH: 31.5 pg (ref 26.0–34.0)
MCHC: 33.6 g/dL (ref 30.0–36.0)
MCV: 93.8 fL (ref 80.0–100.0)
Platelets: 342 10*3/uL (ref 150–400)
RBC: 4.51 MIL/uL (ref 3.87–5.11)
RDW: 12 % (ref 11.5–15.5)
WBC: 8.9 10*3/uL (ref 4.0–10.5)
nRBC: 0 % (ref 0.0–0.2)

## 2021-04-17 LAB — LIPASE, BLOOD: Lipase: 30 U/L (ref 11–51)

## 2021-04-17 MED ORDER — IOHEXOL 300 MG/ML  SOLN
100.0000 mL | Freq: Once | INTRAMUSCULAR | Status: AC | PRN
  Administered 2021-04-17: 100 mL via INTRAVENOUS

## 2021-04-17 MED ORDER — FAMOTIDINE 20 MG PO TABS: 20.0000 mg | ORAL_TABLET | Freq: Two times a day (BID) | ORAL | 0 refills | Status: AC

## 2021-04-17 MED ORDER — ONDANSETRON 4 MG PO TBDP: 4.0000 mg | ORAL_TABLET | Freq: Three times a day (TID) | ORAL | 0 refills | Status: AC | PRN

## 2021-04-17 MED ORDER — FENTANYL CITRATE PF 50 MCG/ML IJ SOSY
50.0000 ug | PREFILLED_SYRINGE | INTRAMUSCULAR | Status: DC | PRN
  Administered 2021-04-17: 50 ug via INTRAVENOUS
  Filled 2021-04-17: qty 1

## 2021-04-17 MED ORDER — ONDANSETRON HCL 4 MG/2ML IJ SOLN
4.0000 mg | Freq: Once | INTRAMUSCULAR | Status: AC
  Administered 2021-04-17: 4 mg via INTRAVENOUS
  Filled 2021-04-17: qty 2

## 2021-04-17 MED ORDER — DICYCLOMINE HCL 20 MG PO TABS: 20.0000 mg | ORAL_TABLET | Freq: Two times a day (BID) | ORAL | 0 refills | Status: AC

## 2021-04-17 NOTE — ED Provider Notes (Signed)
[facility] EMERGENCY DEPARTMENT Provider Note   CSN: [id number] Arrival date & time: 04/17/21  1609     History  Chief Complaint  Patient presents with   Abdominal Pain    [name] is a 46 y.o. female.   Abdominal Pain Patient presents emergency room today with left lower abdominal pain for the past 4 days states that she has had looser stools slightly as well no blood in stool no fevers.  No history of diverticulitis.  She states that the pain is achy and constant and seems to be significantly worse when she pushes on it she occasionally feels some pain when she goes over bumps in the road and the car  No chest pain fevers lightheadedness or dizziness.  No syncope or presyncope.     Home Medications Prior to Admission medications   Medication Sig Start Date End Date Taking? Authorizing Provider  dicyclomine (BENTYL) 20 MG tablet Take 1 tablet (20 mg total) by mouth 2 (two) times daily. 04/17/21  Yes Rocky Gladden S, PA  famotidine (PEPCID) 20 MG tablet Take 1 tablet (20 mg total) by mouth 2 (two) times daily. 04/17/21  Yes Jaylon Boylen S, PA  ondansetron (ZOFRAN-ODT) 4 MG disintegrating tablet Take 1 tablet (4 mg total) by mouth every 8 (eight) hours as needed for nausea or vomiting. 04/17/21  Yes Tamyah Cutbirth, Kathleene Hazel, PA  tiZANidine (ZANAFLEX) 4 MG tablet Take by mouth. 03/07/21  Yes [provider]  Ascorbic Acid (VITAMIN C PO) Take 1 tablet by mouth daily.    [provider]  azithromycin (ZITHROMAX) 250 MG tablet Take 1 tablet (250 mg total) by mouth daily. Take first 2 tablets together, then 1 every day until finished. 01/13/18   Recardo Evangelist, PA-C  celecoxib (CELEBREX) 200 MG capsule  04/04/20   [provider]  Cholecalciferol (VITAMIN D PO) Take 1 tablet by mouth daily.    [provider]  cyclobenzaprine (FLEXERIL) 10 MG tablet Take by mouth. 09/19/18   [provider]  gabapentin (NEURONTIN) 100 MG capsule  Take by mouth.    [provider]  halobetasol (ULTRAVATE) 0.05 % cream Apply topically 2 (two) times daily. Patient taking differently: Apply 1 application topically 2 (two) times daily as needed (itching).  04/21/15   Eustaquio Maize, MD  lidocaine (LIDODERM) 5 % Place 1 patch onto the skin daily. Remove & Discard patch within 12 hours or as directed by MD 04/15/20   Garald Balding, PA-C  Magnesium 250 MG TABS Take 500 mg by mouth 2 (two) times daily.    [provider]  predniSONE (DELTASONE) 20 MG tablet Take 2 tablets (40 mg total) by mouth daily. 01/13/18   Recardo Evangelist, PA-C  sertraline (ZOLOFT) 100 MG tablet Take 100 mg by mouth daily. 04/04/21   [provider]  sertraline (ZOLOFT) 50 MG tablet Take by mouth. 10/27/19   [provider]  SUMAtriptan (IMITREX) 100 MG tablet TAKE 1/2 TABLET AS NEEDED FOR MIGRAINE OR HEADACHE MAY REPEAT IN 2 HOURS IF HEADACHE PERSISTS 08/06/16   Claretta Fraise, MD      Allergies    Codeine, Hydrocodone, Latex, and Morphine and related    Review of Systems   Review of Systems  Gastrointestinal:  Positive for abdominal pain.   Physical Exam Updated Vital Signs BP (!) 142/83 (BP Location: Right Arm)    Pulse (!) 102    Temp 98.6 F (37 C) (Oral)  Resp 18    Ht 5\' 3"  (1.6 m)    Wt 65.8 kg    SpO2 100%    BMI 25.69 kg/m  Physical Exam Vitals and nursing note reviewed.  Constitutional:      General: She is not in acute distress.    Comments: Pleasant uncomfortable appearing 46 year old female in no acute distress.  Speaking full sentences  HENT:     Head: Normocephalic and atraumatic.     Nose: Nose normal.  Eyes:     General: No scleral icterus. Cardiovascular:     Rate and Rhythm: Normal rate and regular rhythm.     Pulses: Normal pulses.     Heart sounds: Normal heart sounds.  Pulmonary:     Effort: Pulmonary effort is normal. No respiratory distress.     Breath sounds: No wheezing.  Abdominal:      Palpations: Abdomen is soft.     Tenderness: There is abdominal tenderness in the left lower quadrant.     Comments: Patient with left upper, left lower and epigastric abdominal tenderness.  No guarding or rebound. No CVA tenderness  Musculoskeletal:     Cervical back: Normal range of motion.     Right lower leg: No edema.     Left lower leg: No edema.  Skin:    General: Skin is warm and dry.     Capillary Refill: Capillary refill takes less than 2 seconds.  Neurological:     Mental Status: She is alert. Mental status is at baseline.  Psychiatric:        Mood and Affect: Mood normal.        Behavior: Behavior normal.    ED Results / Procedures / Treatments   Labs (all labs ordered are listed, but only abnormal results are displayed) Labs Reviewed  URINALYSIS, ROUTINE W REFLEX MICROSCOPIC - Abnormal; Notable for the following components:      Result Value   Color, Urine STRAW (*)    All other components within normal limits  LIPASE, BLOOD  COMPREHENSIVE METABOLIC PANEL  CBC    EKG None  Radiology No results found.  Procedures Procedures    Medications Ordered in ED Medications  ondansetron (ZOFRAN) injection 4 mg (has no administration in time range)  fentaNYL (SUBLIMAZE) injection 50 mcg (has no administration in time range)    ED Course/ Medical Decision Making/ A&P                           Medical Decision Making Amount and/or Complexity of Data Reviewed Labs: ordered. Radiology: ordered.  Risk Prescription drug management.   This patient presents to the ED for concern of abdominal pain, this involves a number of treatment options, and is a complaint that carries with it a high risk of complications and morbidity.  The differential diagnosis includes The causes of generalized abdominal pain include but are not limited to AAA, mesenteric ischemia, appendicitis, diverticulitis, DKA, gastritis, gastroenteritis, AMI, nephrolithiasis, pancreatitis,  peritonitis, adrenal insufficiency,lead poisoning, iron toxicity, intestinal ischemia, constipation, UTI,SBO/LBO, splenic rupture, biliary disease, IBD, IBS, PUD, or hepatitis. Ectopic pregnancy, ovarian torsion, PID.    Co morbidities: Discussed in HPI   Brief History:  Patient presents emergency room today with left lower abdominal pain for the past 4 days states that she has had looser stools slightly as well no blood in stool no fevers.  No history of diverticulitis.  She states that the pain is achy and constant  and seems to be significantly worse when she pushes on it she occasionally feels some pain when she goes over bumps in the road and the car  No chest pain fevers lightheadedness or dizziness.  No syncope or presyncope.   Physical exam: Left lower quadrant and left upper quadrant abdominal tenderness.  There is no guarding or rebound.  No Collins or Turner sign.  There is some mild epigastric tenderness.  Again without rebound or guarding. No CVA tenderness  Given patient's lack of urinary symptoms and overall general appearance have a low suspicion for nephrolithiasis she has a history of this and states that it does not feel like this also she has loose stool as well as nausea associated for symptoms.  Somewhat likely that this could be gastritis, PUD, ovarian cyst.  Very unlikely to be torsion given her symptoms and she has no vaginal discharge that would indicate STD and she is not concerned about STD exposure  We will treat with fentanyl, Zofran and obtain abdominal labs  EMR reviewed including pt PMHx, past surgical history and past visits to ER.   See HPI for more details   Lab Tests:  I ordered and independently interpreted labs.  The pertinent results include:    I personally reviewed all laboratory work and imaging. Metabolic panel without any acute abnormality specifically kidney function within normal limits and no significant electrolyte abnormalities. CBC  without leukocytosis or significant anemia.   Imaging Studies:  CT abdomen pelvis to assess patient's left lower quadrant abdominal tenderness pending at time of handoff  Cardiac Monitoring:  NA NA   Medicines ordered:  I ordered medication including Zofran and fentanyl for nausea and pain Reevaluation of the patient after these medicines showed that the patient  Has not obtained these medications yet.   Critical Interventions:     Consults:      Reevaluation:  After the interventions noted above I re-evaluated patient and found that they have :stayed the same  --they have not yet received medications   Social Determinants of Health:  The patient's social determinants of health were a factor in the care of this patient    Problem List / ED Course:  Abdominal pain Likely PUD, gastritis however may be cyst related doubt torsion.  Will obtain CT.  If negative I would plan to discharge home with medications that have already prescribed.   Dispostion:  Pending oncoming provider reassessment.  Anticipated plan detailed above 6:43 PM       Final Clinical Impression(s) / ED Diagnoses Final diagnoses:  Left lower quadrant abdominal pain  Left upper quadrant abdominal pain    Rx / DC Orders ED Discharge Orders          Ordered    ondansetron (ZOFRAN-ODT) 4 MG disintegrating tablet  Every 8 hours PRN        04/17/21 1838    dicyclomine (BENTYL) 20 MG tablet  2 times daily        04/17/21 1838    famotidine (PEPCID) 20 MG tablet  2 times daily        04/17/21 1838              Tedd Sias, [place] 04/17/21 1843    Hayden Rasmussen, MD 04/18/21 [phone]

## 2021-04-17 NOTE — ED Triage Notes (Signed)
Left lower abdominal pain x 4 days. Bowel changes for 2 months.

## 2021-04-17 NOTE — ED Provider Notes (Signed)
Accepted handoff at shift change from [facility]. Please see prior provider note for more detail.   Briefly: Patient is 46 y.o.   DDX: concern for left upper, left lower quadrant abdominal pain for the last 4 days, with some slightly looser stools.  Plan: Patient with negative work-up for lab work, pending CT results at time of handoff.  I personally interpreted radiographic imaging including CT scan of the abdomen pelvis which showed no acute intra-abdominal abnormality.  I agree with the radiologist interpretation.  Discussed with patient with 4 days of worsening symptoms may represent some kind of viral gastroenteritis or other self-limiting pathology versus she reports that she has had some occasional cramping for months that is just worse today, discussed there could be some component of irritable bowel or other dysfunction.  Patient also endorses a left ovarian cyst that is caused her some pain in the past, there is no evidence of growth, no evidence of constant severe pain to suggest ovarian torsion, but encouraged her to follow-up with OB/GYN for further evaluation of the structure.  At this time patient discharged in stable condition with prescriptions for Pepcid, Zofran, and Bentyl as discussed with Solon Augusta, PA-C.      [place] 04/17/21 2103    Terrilee Files, MD 04/18/21 [phone]

## 2021-04-17 NOTE — Discharge Instructions (Addendum)
Your CT scan and lab work was reassuringly without any acute abnormality today.  I recommend following up with your gastroenterologist if you do not have 1 I recommend following up with your primary care doctor who could recommend 1.  Please drink plenty of water.  Use Zofran as needed for nausea and Bentyl as needed for pain.  Pepcid daily as well.  You may always return to the emergency room for any new or concerning symptoms.  I do recommend abstaining from NSAIDs such as ibuprofen, naproxen, meloxicam Aleve.  Also refrain from any alcohol.

## 2021-04-18 ENCOUNTER — Encounter (HOSPITAL_COMMUNITY): Payer: Self-pay | Admitting: *Deleted

## 2021-04-18 ENCOUNTER — Emergency Department (HOSPITAL_COMMUNITY)
Admission: EM | Admit: 2021-04-18 | Discharge: 2021-04-18 | Discharge status: Against Medical Advice | Payer: No Typology Code available for payment source | Attending: Emergency Medicine | Admitting: Emergency Medicine

## 2021-04-18 ENCOUNTER — Other Ambulatory Visit: Payer: Self-pay

## 2021-04-18 DIAGNOSIS — R197 Diarrhea, unspecified: Secondary | ICD-10-CM | POA: Diagnosis not present

## 2021-04-18 DIAGNOSIS — R109 Unspecified abdominal pain: Secondary | ICD-10-CM | POA: Diagnosis present

## 2021-04-18 DIAGNOSIS — Z5321 Procedure and treatment not carried out due to patient leaving prior to being seen by health care provider: Secondary | ICD-10-CM | POA: Diagnosis not present

## 2021-04-18 NOTE — ED Triage Notes (Signed)
Pt with continued abd pain.  Pt seen at [facility].  Pt states belching and smells bad. + diarrhea today

## 2021-04-19 ENCOUNTER — Inpatient Hospital Stay
Admission: RE | Admit: 2021-04-19 | Discharge: 2021-04-19 | Disposition: A | Discharge status: Home / Self Care | Payer: No Typology Code available for payment source | Source: Ambulatory Visit | Attending: Neurological Surgery | Admitting: Neurological Surgery

## 2021-04-19 NOTE — Discharge Instructions (Signed)

## 2021-04-23 ENCOUNTER — Inpatient Hospital Stay: Admission: RE | Admit: 2021-04-23 | Payer: No Typology Code available for payment source | Source: Ambulatory Visit

## 2021-04-24 ENCOUNTER — Encounter (HOSPITAL_COMMUNITY): Payer: Self-pay

## 2021-04-24 ENCOUNTER — Ambulatory Visit (HOSPITAL_COMMUNITY): Admission: RE | Admit: 2021-04-24 | Payer: No Typology Code available for payment source | Source: Ambulatory Visit

## 2021-05-07 ENCOUNTER — Ambulatory Visit (HOSPITAL_COMMUNITY)
Admission: RE | Admit: 2021-05-07 | Discharge: 2021-05-07 | Disposition: A | Discharge status: Home / Self Care | Payer: No Typology Code available for payment source | Source: Ambulatory Visit | Attending: Neurological Surgery | Admitting: Neurological Surgery

## 2021-05-07 ENCOUNTER — Other Ambulatory Visit: Payer: Self-pay

## 2021-05-07 DIAGNOSIS — M5416 Radiculopathy, lumbar region: Secondary | ICD-10-CM | POA: Insufficient documentation

## 2021-05-15 ENCOUNTER — Other Ambulatory Visit: Payer: Self-pay

## 2021-05-15 ENCOUNTER — Ambulatory Visit
Admission: RE | Admit: 2021-05-15 | Discharge: 2021-05-15 | Disposition: A | Discharge status: Home / Self Care | Payer: No Typology Code available for payment source | Source: Ambulatory Visit | Attending: Neurological Surgery | Admitting: Neurological Surgery

## 2021-05-15 DIAGNOSIS — M5416 Radiculopathy, lumbar region: Secondary | ICD-10-CM

## 2021-05-15 MED ORDER — IOPAMIDOL (ISOVUE-M 200) INJECTION 41%
1.0000 mL | Freq: Once | INTRAMUSCULAR | Status: AC
  Administered 2021-05-15: 1 mL via EPIDURAL

## 2021-05-15 MED ORDER — METHYLPREDNISOLONE ACETATE 40 MG/ML INJ SUSP (RADIOLOG
80.0000 mg | Freq: Once | INTRAMUSCULAR | Status: AC
  Administered 2021-05-15: 80 mg via EPIDURAL

## 2021-05-15 NOTE — Discharge Instructions (Signed)

## 2021-06-25 ENCOUNTER — Ambulatory Visit: Payer: BLUE CROSS/BLUE SHIELD | Admitting: Allergy & Immunology

## 2021-07-09 ENCOUNTER — Other Ambulatory Visit: Payer: Self-pay | Admitting: Neurological Surgery

## 2021-07-09 DIAGNOSIS — M5416 Radiculopathy, lumbar region: Secondary | ICD-10-CM

## 2021-07-19 ENCOUNTER — Ambulatory Visit
Admission: RE | Admit: 2021-07-19 | Discharge: 2021-07-19 | Disposition: A | Discharge status: Home / Self Care | Payer: No Typology Code available for payment source | Source: Ambulatory Visit | Attending: Neurological Surgery | Admitting: Neurological Surgery

## 2021-07-19 DIAGNOSIS — M5416 Radiculopathy, lumbar region: Secondary | ICD-10-CM

## 2021-07-19 MED ORDER — METHYLPREDNISOLONE ACETATE 40 MG/ML INJ SUSP (RADIOLOG
80.0000 mg | Freq: Once | INTRAMUSCULAR | Status: AC
  Administered 2021-07-19: 80 mg via EPIDURAL

## 2021-07-19 MED ORDER — IOPAMIDOL (ISOVUE-M 200) INJECTION 41%
1.0000 mL | Freq: Once | INTRAMUSCULAR | Status: AC
  Administered 2021-07-19: 1 mL via EPIDURAL

## 2021-07-19 NOTE — Discharge Instructions (Signed)

## 2021-08-13 ENCOUNTER — Ambulatory Visit: Payer: BLUE CROSS/BLUE SHIELD | Admitting: Allergy & Immunology

## 2021-08-28 ENCOUNTER — Ambulatory Visit: Payer: No Typology Code available for payment source | Attending: Neurological Surgery | Admitting: Physical Therapy

## 2021-08-28 ENCOUNTER — Encounter: Payer: Self-pay | Admitting: Physical Therapy

## 2021-08-28 DIAGNOSIS — M5459 Other low back pain: Secondary | ICD-10-CM | POA: Insufficient documentation

## 2021-08-28 NOTE — Therapy (Signed)
[name] [address] [place], [place], [address] Phone: [phone]   Fax:  [phone]  Physical Therapy Evaluation  Patient Details  Name: [name] MRN: [id number] Date of Birth: 09-Nov-1975 Referring Provider (PT): Autumn Patty MD   Encounter Date: 08/28/2021   PT End of Session - 08/28/21 1434     Visit Number 1    Number of Visits 6    Date for PT Re-Evaluation 10/09/21    PT Start Time 0145    PT Stop Time 0220    PT Time Calculation (min) 35 min    Activity Tolerance Patient tolerated treatment well    Behavior During Therapy [facility] for tasks assessed/performed             Past Medical History:  Diagnosis Date   Anxiety    Arthritis    Chronic back pain    Chronic pain    Endometriosis    Fibromyalgia    History of seizures    Reportedly related to migraine headaches   Kidney stone    Migraine headache     Past Surgical History:  Procedure Laterality Date   APPENDECTOMY     CHOLECYSTECTOMY     VAGINAL HYSTERECTOMY      There were no vitals filed for this visit.    Subjective Assessment - 08/28/21 1442     Subjective The patient presents to the clinic today with c/o low back with radiation of pain and numbness into both LE's.  She states that she has had low back pain for many years and it has gotten progressively worse.  She rates her pain at an 8/10 today.  She has not found anything really gives her any significnat pain relief.  She is not able to sit or standing for prolonged periods of time due to rising pain-levels.  Bending is also very painful.  She c/o pain over both anterior thighs and also over the region of her righ Tibialis Anterior and right fifith toe.  Spinal injections have not been helpful.    Pertinent History Chronic pain, h/o neck pain, h/o SIJ pain, Fibromyalgia.    How long can you sit comfortably? The longer she sits the higher her pain-level becomes.    How long can you  stand comfortably? The longer she stands the higher her pain-level becomes.    How long can you walk comfortably? The longer she walks the higher her pain-level becomes.    Diagnostic tests Moderate spinal canal and bilateral lateral recess stenosis at L4-L5  due to broad-based disc bulging, ligamentum flavum hypertrophy and  bilateral facet arthropathy. Abutment of the descending L5 nerve  roots bilaterally. Mild left neural foraminal stenosis at this  level.     Degenerative changes at L2-L3 and L3-L4 without significant  stenosis.    Patient Stated Goals Have a good quality of life.    Currently in Pain? Yes    Pain Score 8     Pain Location Back   Bilateral LE's.   Pain Orientation Right;Left;Mid    Pain Descriptors / Indicators Aching;Burning;Sharp;Shooting;Numbness;Throbbing    Pain Type Chronic pain    Pain Radiating Towards Bilateral LE's.    Pain Onset More than a month ago    Pain Frequency Constant    Aggravating Factors  See above.    Pain Relieving Factors See above.    Effect of Pain on Daily Activities Quality of life affected due to pain.  Limited ability to perform  ADL's.                Marlane Mingle PT Assessment - 08/28/21 0001       Assessment   Medical Diagnosis Lumbar radiculopathy.    Referring Provider (PT) Autumn Patty MD    Onset Date/Surgical Date --   Ongoing.     Precautions   Precautions None      Restrictions   Weight Bearing Restrictions No      Balance Screen   Has the patient fallen in the past 6 months No    Has the patient had a decrease in activity level because of a fear of falling?  Yes    Is the patient reluctant to leave their home because of a fear of falling?  No      Home [employer] information [employer] manager residence      Prior Function   Level of Independence Independent      Posture/Postural Control   Posture/Postural Control No significant limitations      Deep Tendon Reflexes   DTR Assessment Site Patella;Achilles     Patella DTR 2+    Achilles DTR 2+      ROM / Strength   AROM / PROM / Strength AROM;Strength      AROM   Overall AROM Comments Active lumbar flexion painful and limited to 35 degrees.  Active lumbar extension limited to 12 degrees.      Strength   Overall Strength Comments LE strength difficult to assess due to high pain level with bilateral knee extension and bilateral ankle dorsiflexion graded grossly at 4/5.      Palpation   Palpation comment Patient very palpably tender with overpressure at L4/5, L5-S1.      Special Tests   Other special tests Equal leg lengths.  Positive bilateral SLR's.      Transfers   Comments Transitoty movements are performed slowly with patient in obvious pain.      Ambulation/Gait   Gait Comments The patient walks slow in some spinal flexion in obvious pain.                        Objective measurements completed on examination: See above findings.       [facility] Adult PT Treatment/Exercise - 08/28/21 0001       Modalities   Modalities Moist Heat      Moist Heat Therapy   Number Minutes Moist Heat 10 Minutes    Moist Heat Location Lumbar Spine   In supne with wedge under knees for comfort.                         PT Long Term Goals - 08/28/21 1535       PT LONG TERM GOAL #1   Title Independent with a HEP.    Time 6    Period Weeks    Status New      PT LONG TERM GOAL #2   Title Sit 30 minutes with pain not > 5/10.    Time 6    Period Weeks    Status New      PT LONG TERM GOAL #3   Title Stand 30 minutes with pain not > 5/10.    Time 6    Period Weeks    Status New      PT LONG TERM GOAL #4   Title Perform ADL's with pain not >  5/10.    Time 6    Period Weeks    Status New                    Plan - 08/28/21 1516     Clinical Impression Statement The patient presents to [facility] with chronic low back pain.  She presented to the clinic in a great deal of pain with c/o lower back  pain and radiation of symptoms into bilateral LE's.  Today, she reports pain further down her right LE to her fifth toe.  Her active spinal flexion is very limited.  Transitory movements are performed slowly due to pain.  The patient walks slowly in some spinal flexion.  She cannot sit, stand or walk for long periods of time.  She is very limited in her ability to perform ADL's due to pain.    Personal Factors and Comorbidities Other;Comorbidity 1    Comorbidities Chronic pain, h/o neck pain, h/o SIJ pain, Fibromyalgia.    Examination-Activity Limitations Locomotion Level;Other;Transfers;Bed Mobility;Stand;Sit    Examination-Participation Restrictions Other    Stability/Clinical Decision Making Evolving/Moderate complexity    Clinical Decision Making Low    Rehab Potential Poor    PT Frequency 1x / week    PT Duration 6 weeks    PT Treatment/Interventions ADLs/Self Care Home Management;Cryotherapy;Electrical Stimulation;Ultrasound;Moist Heat;Functional mobility training;Therapeutic activities;Therapeutic exercise;Patient/family education;Manual techniques;Passive range of motion    PT Next Visit Plan Modalities and STW/M, low-level core exercise progression.    Consulted and Agree with Plan of Care Patient             Patient will benefit from skilled therapeutic intervention in order to improve the following deficits and impairments:  Abnormal gait, Decreased activity tolerance, Decreased mobility, Decreased range of motion, Decreased strength, Pain  Visit Diagnosis: Other low back pain - Plan: PT plan of care cert/re-cert     Problem List Patient Active Problem List   Diagnosis Date Noted   Herpes simplex 11/13/2017   Premenstrual tension syndrome 11/13/2017   Urinary incontinence 11/13/2017   Calculus of kidney 11/05/2017   Endometriosis 11/05/2017   Seizure (HCC) 11/05/2017   Primary fibromyalgia syndrome 11/05/2017   Intractable migraine with aura without status migrainosus  06/06/2017   Anxiety 01/09/2017   Headache 12/12/2016   Avascular necrosis of bones of both hips (HCC) 11/19/2014   Fibrositis 02/28/2014   Headache, migraine 02/28/2014   Fibromyalgia 02/28/2014   Migraines 02/28/2014   Rationale for Evaluation and Treatment Rehabilitation.  Antoninette Lerner, [place], PT 08/28/2021, 3:38 PM  [facility] [address] [place], [place], [address] Phone: [phone]   Fax:  [phone]  Name: TURKESSA OSTROM MRN: [id number] Date of Birth: 06/15/1975

## 2021-09-03 ENCOUNTER — Encounter: Payer: No Typology Code available for payment source | Admitting: Physical Therapy

## 2021-09-05 ENCOUNTER — Ambulatory Visit: Payer: No Typology Code available for payment source | Admitting: Physical Therapy

## 2021-09-05 DIAGNOSIS — M5459 Other low back pain: Secondary | ICD-10-CM

## 2021-09-05 NOTE — Therapy (Signed)
[name] [address] [place], [place], [address] Phone: [phone]   Fax:  [phone]  Physical Therapy Treatment  Patient Details  Name: [name] MRN: [id number] Date of Birth: 11-Mar-1976 Referring Provider (PT): Autumn Patty MD   Encounter Date: 09/05/2021   PT End of Session - 09/05/21 1340     Visit Number 2    Number of Visits 6    Date for PT Re-Evaluation 10/09/21    PT Start Time 0105    PT Stop Time 0146    PT Time Calculation (min) 41 min    Activity Tolerance Patient tolerated treatment well    Behavior During Therapy [facility] for tasks assessed/performed             Past Medical History:  Diagnosis Date   Anxiety    Arthritis    Chronic back pain    Chronic pain    Endometriosis    Fibromyalgia    History of seizures    Reportedly related to migraine headaches   Kidney stone    Migraine headache     Past Surgical History:  Procedure Laterality Date   APPENDECTOMY     CHOLECYSTECTOMY     VAGINAL HYSTERECTOMY      There were no vitals filed for this visit.   Subjective Assessment - 09/05/21 1341     Subjective Pain at a 7.    Pertinent History Chronic pain, h/o neck pain, h/o SIJ pain, Fibromyalgia.    How long can you sit comfortably? The longer she sits the higher her pain-level becomes.    How long can you stand comfortably? The longer she stands the higher her pain-level becomes.    Diagnostic tests Moderate spinal canal and bilateral lateral recess stenosis at L4-L5  due to broad-based disc bulging, ligamentum flavum hypertrophy and  bilateral facet arthropathy. Abutment of the descending L5 nerve  roots bilaterally. Mild left neural foraminal stenosis at this  level.     Degenerative changes at L2-L3 and L3-L4 without significant  stenosis.    Patient Stated Goals Have a good quality of life.    Currently in Pain? Yes    Pain Score 7     Pain Location Back    Pain Orientation  Right;Left;Mid    Pain Descriptors / Indicators Aching;Burning;Shooting;Numbness    Pain Type Chronic pain    Pain Onset More than a month ago                               [facility] Adult PT Treatment/Exercise - 09/05/21 0001       Modalities   Modalities Electrical Stimulation;Moist Heat;Ultrasound      Moist Heat Therapy   Number Minutes Moist Heat 9 Minutes    Moist Heat Location Lumbar Spine      Electrical Stimulation   Electrical Stimulation Location Lower lumbar    Electrical Stimulation Action IFC at 80-150 Hz.    Electrical Stimulation Parameters 40% scan x 9 minutes.    Electrical Stimulation Goals Tone;Pain      Ultrasound   Ultrasound Location Bilateral lower lumbar    Ultrasound Parameters [place] at 1.50 W/CM2 x 12 minutes.    Ultrasound Goals Pain      Manual Therapy   Manual Therapy Soft tissue mobilization    Soft tissue mobilization STW/M x 11 minutes to patient's bilateral lower lumbar region.  PT Long Term Goals - 08/28/21 1535       PT LONG TERM GOAL #1   Title Independent with a HEP.    Time 6    Period Weeks    Status New      PT LONG TERM GOAL #2   Title Sit 30 minutes with pain not > 5/10.    Time 6    Period Weeks    Status New      PT LONG TERM GOAL #3   Title Stand 30 minutes with pain not > 5/10.    Time 6    Period Weeks    Status New      PT LONG TERM GOAL #4   Title Perform ADL's with pain not > 5/10.    Time 6    Period Weeks    Status New                   Plan - 09/05/21 1355     Clinical Impression Statement The patient presents to the clinic today with a high pain-level.  She was very tender to palpation with soft tissue work.  She was experiencing referred pain into her right hip today.    Personal Factors and Comorbidities Other;Comorbidity 1    Comorbidities Chronic pain, h/o neck pain, h/o SIJ pain, Fibromyalgia.    Examination-Activity Limitations  Locomotion Level;Other;Transfers;Bed Mobility;Stand;Sit    Examination-Participation Restrictions Other    Stability/Clinical Decision Making Evolving/Moderate complexity    Rehab Potential Poor    PT Frequency 1x / week    PT Duration 6 weeks    PT Treatment/Interventions ADLs/Self Care Home Management;Cryotherapy;Electrical Stimulation;Ultrasound;Moist Heat;Functional mobility training;Therapeutic activities;Therapeutic exercise;Patient/family education;Manual techniques;Passive range of motion    PT Next Visit Plan Modalities and STW/M, low-level core exercise progression.    Consulted and Agree with Plan of Care Patient             Patient will benefit from skilled therapeutic intervention in order to improve the following deficits and impairments:  Abnormal gait, Decreased activity tolerance, Decreased mobility, Decreased range of motion, Decreased strength, Pain  Visit Diagnosis: Other low back pain     Problem List Patient Active Problem List   Diagnosis Date Noted   Herpes simplex 11/13/2017   Premenstrual tension syndrome 11/13/2017   Urinary incontinence 11/13/2017   Calculus of kidney 11/05/2017   Endometriosis 11/05/2017   Seizure (HCC) 11/05/2017   Primary fibromyalgia syndrome 11/05/2017   Intractable migraine with aura without status migrainosus 06/06/2017   Anxiety 01/09/2017   Headache 12/12/2016   Avascular necrosis of bones of both hips (HCC) 11/19/2014   Fibrositis 02/28/2014   Headache, migraine 02/28/2014   Fibromyalgia 02/28/2014   Migraines 02/28/2014   Rationale for Evaluation and Treatment Rehabilitation.  Ovella Manygoats, [place], PT 09/05/2021, 3:12 PM  [facility] [address] [place], [place], [address] Phone: [phone]   Fax:  [phone]  Name: [name] MRN: [id number] Date of Birth: 1975/08/16

## 2021-09-10 ENCOUNTER — Ambulatory Visit: Payer: No Typology Code available for payment source | Attending: Neurological Surgery | Admitting: *Deleted

## 2021-09-10 DIAGNOSIS — M5459 Other low back pain: Secondary | ICD-10-CM | POA: Insufficient documentation

## 2021-09-18 ENCOUNTER — Encounter: Payer: No Typology Code available for payment source | Admitting: *Deleted

## 2021-09-20 ENCOUNTER — Encounter: Payer: Self-pay | Admitting: *Deleted

## 2021-09-20 ENCOUNTER — Ambulatory Visit: Payer: No Typology Code available for payment source | Admitting: *Deleted

## 2021-09-20 DIAGNOSIS — M5459 Other low back pain: Secondary | ICD-10-CM | POA: Diagnosis not present

## 2021-09-20 NOTE — Therapy (Signed)
OUTPATIENT PHYSICAL THERAPY TREATMENT NOTE   Patient Name: [name] MRN: [id number] DOB:July 01, 1975, 46 y.o., female Today's Date: 09/20/2021  PCP:  Cindi Carbon PROVIDER: Autumn Patty MD   PT End of Session - 09/20/21 1350     Visit Number 3    Number of Visits 6    Date for PT Re-Evaluation 10/09/21    PT Start Time 1349    PT Stop Time 1435    PT Time Calculation (min) 46 min             Past Medical History:  Diagnosis Date   Anxiety    Arthritis    Chronic back pain    Chronic pain    Endometriosis    Fibromyalgia    History of seizures    Reportedly related to migraine headaches   Kidney stone    Migraine headache    Past Surgical History:  Procedure Laterality Date   APPENDECTOMY     CHOLECYSTECTOMY     VAGINAL HYSTERECTOMY     Patient Active Problem List   Diagnosis Date Noted   Herpes simplex 11/13/2017   Premenstrual tension syndrome 11/13/2017   Urinary incontinence 11/13/2017   Calculus of kidney 11/05/2017   Endometriosis 11/05/2017   Seizure (HCC) 11/05/2017   Primary fibromyalgia syndrome 11/05/2017   Intractable migraine with aura without status migrainosus 06/06/2017   Anxiety 01/09/2017   Headache 12/12/2016   Avascular necrosis of bones of both hips (HCC) 11/19/2014   Fibrositis 02/28/2014   Headache, migraine 02/28/2014   Fibromyalgia 02/28/2014   Migraines 02/28/2014    REFERRING DIAG:   Lumbar radiculopathy    THERAPY DIAG:  Other low back pain  Rationale for Evaluation and Treatment Rehabilitation  PERTINENT HISTORY:   Chronic pain, h/o neck pain, h/o SIJ pain, Fibromyalgia    PRECAUTIONS:   SUBJECTIVE:   PAIN:  Are you having pain? Yes: NPRS scale: 7/10 Pain location: LB Pain description: Aching, burning, sharp Aggravating factors: Prolonged sitting, standing, and walking Relieving factors: nothing     TODAY'S TREATMENT:                                      EXERCISE LOG  Exercise Repetitions  and Resistance Comments  SOC X10 and find neutral position   Drawin for TA X10 hold 5-10 secs                Blank cell = exercise not performed today   Modalities  Date:  Unattended Estim: Lumbar, IFC 80-150hz , 15 mins, Pain Ultrasound: Lumbar, Combo 1.5 w/cm2, 8 mins, Pain            PATIENT EDUCATION: Education details: Discussed hinge bending at hips and finding neutral pelvis position and  avoiding flexion in LB Person educated: Patient Education method: Medical illustrator Education comprehension: verbalized understanding   HOME EXERCISE PROGRAM: Handouts given for HEP exs performed today.     PT Long Term Goals - 09/20/21 1653       PT LONG TERM GOAL #1   Title Independent with a HEP.    Time 6    Period Weeks    Status New      PT LONG TERM GOAL #2   Title Sit 30 minutes with pain not > 5/10.    Time 6    Period Weeks    Status New      PT  LONG TERM GOAL #3   Title Stand 30 minutes with pain not > 5/10.    Time 6    Period Weeks    Status New      PT LONG TERM GOAL #4   Title Perform ADL's with pain not > 5/10.    Time 6    Period Weeks    Status New              Plan - 09/20/21 1654     Clinical Impression Statement Pt arrived today doing fair , but still with high pain levels. Rx focused on [facility] to find neutral position as well as Drawin for TA activation. Hinge bending also discussed for body mechanics to avoid LB strain/ pain triggers. [place] combo and IFC for pain management performed in sitting and resting on plinth for comfort   Personal Factors and Comorbidities Other;Comorbidity 1    Comorbidities Chronic pain, h/o neck pain, h/o SIJ pain, Fibromyalgia.    Examination-Activity Limitations Locomotion Level;Other;Transfers;Bed Mobility;Stand;Sit    Stability/Clinical Decision Making Evolving/Moderate complexity    Rehab Potential Poor    PT Frequency 1x / week    PT Duration 6 weeks    PT Treatment/Interventions  ADLs/Self Care Home Management;Cryotherapy;Electrical Stimulation;Ultrasound;Moist Heat;Functional mobility training;Therapeutic activities;Therapeutic exercise;Patient/family education;Manual techniques;Passive range of motion    PT Next Visit Plan Modalities and STW/M, low-level core exercise progression.    Consulted and Agree with Plan of Care Patient               Daimion Adamcik,CHRIS, PTA 09/20/2021, 4:54 PM

## 2021-09-27 ENCOUNTER — Encounter: Payer: No Typology Code available for payment source | Admitting: *Deleted

## 2021-10-09 ENCOUNTER — Ambulatory Visit: Payer: No Typology Code available for payment source | Attending: Family Medicine

## 2021-10-09 DIAGNOSIS — M5459 Other low back pain: Secondary | ICD-10-CM | POA: Insufficient documentation

## 2021-10-09 NOTE — Therapy (Signed)
OUTPATIENT PHYSICAL THERAPY TREATMENT NOTE   Patient Name: [name] MRN: [id number] DOB:08/09/1975, 46 y.o., female Today's Date: 10/09/2021  PCP:  Cindi Carbon PROVIDER: Autumn Patty MD   PT End of Session - 10/09/21 1413     Visit Number 4    Number of Visits 6    Date for PT Re-Evaluation 10/09/21    PT Start Time 1345             Past Medical History:  Diagnosis Date   Anxiety    Arthritis    Chronic back pain    Chronic pain    Endometriosis    Fibromyalgia    History of seizures    Reportedly related to migraine headaches   Kidney stone    Migraine headache    Past Surgical History:  Procedure Laterality Date   APPENDECTOMY     CHOLECYSTECTOMY     VAGINAL HYSTERECTOMY     Patient Active Problem List   Diagnosis Date Noted   Herpes simplex 11/13/2017   Premenstrual tension syndrome 11/13/2017   Urinary incontinence 11/13/2017   Calculus of kidney 11/05/2017   Endometriosis 11/05/2017   Seizure (HCC) 11/05/2017   Primary fibromyalgia syndrome 11/05/2017   Intractable migraine with aura without status migrainosus 06/06/2017   Anxiety 01/09/2017   Headache 12/12/2016   Avascular necrosis of bones of both hips (HCC) 11/19/2014   Fibrositis 02/28/2014   Headache, migraine 02/28/2014   Fibromyalgia 02/28/2014   Migraines 02/28/2014    REFERRING DIAG:   Lumbar radiculopathy    THERAPY DIAG:  Other low back pain  Rationale for Evaluation and Treatment Rehabilitation  PERTINENT HISTORY:   Chronic pain, h/o neck pain, h/o SIJ pain, Fibromyalgia    PRECAUTIONS:   SUBJECTIVE:  Pt reports that she has increased pain in three toes on right foot.  Pt struggles to get comfortable.   PAIN:  Are you having pain? Yes: NPRS scale: 8/10 Pain location: LB Pain description: Aching, burning, sharp Aggravating factors: Prolonged sitting, standing, and walking Relieving factors: nothing   TODAY'S TREATMENT:  Manual Therapy Soft Tissue  Mobilization: lumbar spine, STW/M to bilateral lumbar paraspinals to decrease pain and tone with pt positioned in left side-lying.  Pt struggles to get comfortable.                        Modalities  Date:  Unattended Estim: Lumbar, IFC 80-150hz , 20 mins, Pain and Tone Hot Pack: Lumbar, 20 mins, Pain and Tone            PATIENT EDUCATION: Education details: Discussed hinge bending at hips and finding neutral pelvis position and  avoiding flexion in LB Person educated: Patient Education method: Medical illustrator Education comprehension: verbalized understanding   HOME EXERCISE PROGRAM: Handouts given for HEP exs performed today.     PT Long Term Goals - 09/20/21 1653       PT LONG TERM GOAL #1   Title Independent with a HEP.    Time 6    Period Weeks    Status New      PT LONG TERM GOAL #2   Title Sit 30 minutes with pain not > 5/10.    Time 6    Period Weeks    Status New      PT LONG TERM GOAL #3   Title Stand 30 minutes with pain not > 5/10.    Time 6    Period Weeks  Status New      PT LONG TERM GOAL #4   Title Perform ADL's with pain not > 5/10.    Time 6    Period Weeks    Status New              Plan - 09/20/21 1654     Clinical Impression Statement Pt arrives for today's treatment session reporting 8/10 low back pain.  Pt reports that pain radiates down right LE into toes.  Pt unable to tolerate Nustep due to increased pain.  STW/M performed to bilateral lumbar paraspinals to decrease pain and tone.  Pt positioned in right side-lying with someone comfort.  Pt struggles to get comfortable throughout treatment.  Normal responses to estim and MH noted upon removal.  Pt reported 7/10 low back pain at completion of today's treatment session.    Personal Factors and Comorbidities Other;Comorbidity 1    Comorbidities Chronic pain, h/o neck pain, h/o SIJ pain, Fibromyalgia.    Examination-Activity Limitations Locomotion  Level;Other;Transfers;Bed Mobility;Stand;Sit    Stability/Clinical Decision Making Evolving/Moderate complexity    Rehab Potential Poor    PT Frequency 1x / week    PT Duration 6 weeks    PT Treatment/Interventions ADLs/Self Care Home Management;Cryotherapy;Electrical Stimulation;Ultrasound;Moist Heat;Functional mobility training;Therapeutic activities;Therapeutic exercise;Patient/family education;Manual techniques;Passive range of motion    PT Next Visit Plan Modalities and STW/M, low-level core exercise progression.    Consulted and Agree with Plan of Care Patient               Newman Pies, PTA 10/09/2021, 2:29 PM

## 2021-10-16 ENCOUNTER — Encounter: Payer: No Typology Code available for payment source | Admitting: *Deleted

## 2021-10-19 ENCOUNTER — Ambulatory Visit: Payer: No Typology Code available for payment source

## 2021-10-19 DIAGNOSIS — M5459 Other low back pain: Secondary | ICD-10-CM

## 2021-10-19 NOTE — Therapy (Signed)
OUTPATIENT PHYSICAL THERAPY TREATMENT NOTE   Patient Name: [name] MRN: [id number] DOB:11/24/1975, 46 y.o., female Today's Date: 10/19/2021  PCP:  Loistine Chance PROVIDER: Emelda Brothers MD   PT End of Session - 10/19/21 0949     Visit Number 5    Number of Visits 6    Date for PT Re-Evaluation 10/09/21    PT Start Time 0946    PT Stop Time 1011    PT Time Calculation (min) 25 min    Activity Tolerance Patient limited by pain;Treatment limited secondary to medical complications (Comment)   Potential Cauda Equina Syndrome; patient educated to contact her referring physician immediently   Behavior During Therapy [facility] for tasks assessed/performed              Past Medical History:  Diagnosis Date   Anxiety    Arthritis    Chronic back pain    Chronic pain    Endometriosis    Fibromyalgia    History of seizures    Reportedly related to migraine headaches   Kidney stone    Migraine headache    Past Surgical History:  Procedure Laterality Date   APPENDECTOMY     CHOLECYSTECTOMY     VAGINAL HYSTERECTOMY     Patient Active Problem List   Diagnosis Date Noted   Herpes simplex 11/13/2017   Premenstrual tension syndrome 11/13/2017   Urinary incontinence 11/13/2017   Calculus of kidney 11/05/2017   Endometriosis 11/05/2017   Seizure ([place]) 11/05/2017   Primary fibromyalgia syndrome 11/05/2017   Intractable migraine with aura without status migrainosus 06/06/2017   Anxiety 01/09/2017   Headache 12/12/2016   Avascular necrosis of bones of both hips ([place]) 11/19/2014   Fibrositis 02/28/2014   Headache, migraine 02/28/2014   Fibromyalgia 02/28/2014   Migraines 02/28/2014    REFERRING DIAG:   Lumbar radiculopathy    THERAPY DIAG:  Other low back pain  Rationale for Evaluation and Treatment Rehabilitation  PERTINENT HISTORY:   Chronic pain, h/o neck pain, h/o SIJ pain, Fibromyalgia    PRECAUTIONS:   SUBJECTIVE:  Patient reports that she is struggling.  She notes that she had to get her son to flip her mattress to help her sleep a little better. Her pain is going down both hips and legs. She has been taking pain medication for the last week.  PAIN:  Are you having pain? Yes: NPRS scale: 8/10 Pain location: LB Pain description: Aching, burning, sharp Aggravating factors: Prolonged sitting, standing, and walking Relieving factors: nothing   TODAY'S TREATMENT:                                    8/11 Treatment Manual Therapy Soft Tissue Mobilization: to lumbar paraspinals, held due to significant exacerbation to her familiar symptoms Joint Mobilizations: L3-5, grade I attempted, but held due to significant exacerbation to her familiar symptoms    LOWER EXTREMITY MMT:  all MMT aggravated her familiar pain  MMT Right 10/19/21 Left 10/19/21  Hip flexion 3+/5 3+/5  Hip extension    Hip abduction    Hip adduction    Hip internal rotation    Hip external rotation    Knee flexion    Knee extension 3/5 3/5  Ankle dorsiflexion 3/5 3/5  Ankle plantarflexion    Ankle inversion    Ankle eversion     (Blank rows = not tested)   SENSATION:  Diminished throughout  both lower extremities  10/09/21 Treatment Manual Therapy Soft Tissue Mobilization: lumbar spine, STW/M to bilateral lumbar paraspinals to decrease pain and tone with pt positioned in left side-lying.  Pt struggles to get comfortable.                        Modalities  Date:  Unattended Estim: Lumbar, IFC 80-150hz , 20 mins, Pain and Tone Hot Pack: Lumbar, 20 mins, Pain and Tone  PATIENT EDUCATION: Education details: symptoms, contacting her referring physician immediately,  Person educated: Patient Education method: Customer service manager Education comprehension: verbalized understanding   HOME EXERCISE PROGRAM: Contact her physician      PT Long Term Goals - 09/20/21 1653       PT LONG TERM GOAL #1   Title Independent with a HEP.    Time 6    Period Weeks     Status Not met     PT LONG TERM GOAL #2   Title Sit 30 minutes with pain not > 5/10.    Time 6    Period Weeks    Status Not met      PT LONG TERM GOAL #3   Title Stand 30 minutes with pain not > 5/10.    Time 6    Period Weeks    Status Not met     PT LONG TERM GOAL #4   Title Perform ADL's with pain not > 5/10.    Time 6    Period Weeks    Status Not met             Plan - 09/20/21 1654     Clinical Impression Statement Patient presented to treatment with multiple new changes since her last appointment including pain and numbness radiating down both lower extremities, increased muscular weakness, and incontinence. Manual therapy was attempted, but this significantly exacerbated her familiar symptoms which resulted in this being held at this time. She was educated on her symptoms and that she needs to contact her referring physician regarding these new changes. Her strength and sensation was assessed with her sensation being diminished and her muscular strength significantly decreased since her evaluation. Recommend that she follow up with her referring physician for further medical care prior to continuing with physical therapy.    Personal Factors and Comorbidities Other;Comorbidity 1    Comorbidities Chronic pain, h/o neck pain, h/o SIJ pain, Fibromyalgia.    Examination-Activity Limitations Locomotion Level;Other;Transfers;Bed Mobility;Stand;Sit    Stability/Clinical Decision Making Evolving/Moderate complexity    Rehab Potential Poor    PT Frequency 1x / week    PT Duration 6 weeks    PT Treatment/Interventions ADLs/Self Care Home Management;Cryotherapy;Electrical Stimulation;Ultrasound;Moist Heat;Functional mobility training;Therapeutic activities;Therapeutic exercise;Patient/family education;Manual techniques;Passive range of motion    PT Next Visit Plan Modalities and STW/M, low-level core exercise progression.    Consulted and Agree with Plan of Care Patient             Progress Note Reporting Period 08/28/21 to 10/19/21  See note below for Objective Data and Assessment of Progress/Goals.    Patient has not met any of her goals for physical therapy. This can be attributed to her increase in pain and development of new symptoms such as bilateral lower extremity numbness and weakness, increased pain, and incontinence. Recommend that she follow up with her referring physician for further medical care prior to continuing with physical therapy.     Darlin Coco, PT 10/19/2021, 11:03 AM

## 2021-10-22 ENCOUNTER — Ambulatory Visit: Payer: No Typology Code available for payment source | Admitting: Physical Therapy

## 2021-10-23 ENCOUNTER — Ambulatory Visit: Payer: No Typology Code available for payment source

## 2021-10-24 ENCOUNTER — Ambulatory Visit: Payer: No Typology Code available for payment source

## 2021-10-24 DIAGNOSIS — M5459 Other low back pain: Secondary | ICD-10-CM | POA: Diagnosis not present

## 2021-10-24 NOTE — Therapy (Signed)
OUTPATIENT PHYSICAL THERAPY TREATMENT NOTE   Patient Name: [name] MRN: [id number] DOB:[birth year], 46 y.o., female Today's Date: 10/24/2021  PCP:  Loistine Chance PROVIDER: Emelda Brothers MD   PT End of Session - 10/24/21 1309     Visit Number 6    Number of Visits 6    Date for PT Re-Evaluation 10/09/21    PT Start Time 1310    PT Stop Time 1328    PT Time Calculation (min) 18 min    Activity Tolerance Patient limited by pain;Treatment limited secondary to medical complications (Comment)   Potential Cauda Equina Syndrome; patient educated to contact her referring physician immediently   Behavior During Therapy [facility] for tasks assessed/performed              Past Medical History:  Diagnosis Date   Anxiety    Arthritis    Chronic back pain    Chronic pain    Endometriosis    Fibromyalgia    History of seizures    Reportedly related to migraine headaches   Kidney stone    Migraine headache    Past Surgical History:  Procedure Laterality Date   APPENDECTOMY     CHOLECYSTECTOMY     VAGINAL HYSTERECTOMY     Patient Active Problem List   Diagnosis Date Noted   Herpes simplex 11/13/2017   Premenstrual tension syndrome 11/13/2017   Urinary incontinence 11/13/2017   Calculus of kidney 11/05/2017   Endometriosis 11/05/2017   Seizure ([place]) 11/05/2017   Primary fibromyalgia syndrome 11/05/2017   Intractable migraine with aura without status migrainosus 06/06/2017   Anxiety 01/09/2017   Headache 12/12/2016   Avascular necrosis of bones of both hips ([place]) 11/19/2014   Fibrositis 02/28/2014   Headache, migraine 02/28/2014   Fibromyalgia 02/28/2014   Migraines 02/28/2014    REFERRING DIAG:   Lumbar radiculopathy    THERAPY DIAG:  Other low back pain  Rationale for Evaluation and Treatment Rehabilitation  PERTINENT HISTORY:   Chronic pain, h/o neck pain, h/o SIJ pain, Fibromyalgia    PRECAUTIONS:   SUBJECTIVE:  Patient reports that she tried calling  her neurosurgeon, but she has not heard back from them yet. She notes that her right hip is bothering her the most right now. However, both legs are bothering her now. She has noticed that she can get numbness between her legs when sitting for prolonged periods.  PAIN:  Are you having pain? Yes: NPRS scale: 10/10 Pain location: LB Pain description: Aching, burning, sharp Aggravating factors: Prolonged sitting, standing, and walking Relieving factors: nothing   TODAY'S TREATMENT:                                    8/16 EXERCISE LOG  Exercise Repetitions and Resistance Comments  Lateral weight shift  Attempted limited by pulling in right hip and low back   Glute sets  <50% max effort x 10 reps Limited by low back pain               Blank cell = exercise not performed today                                    8/11 Treatment Manual Therapy Soft Tissue Mobilization: to lumbar paraspinals, held due to significant exacerbation to her familiar symptoms Joint Mobilizations: L3-5, grade I attempted,  but held due to significant exacerbation to her familiar symptoms    LOWER EXTREMITY MMT:  all MMT aggravated her familiar pain  MMT Right 10/19/21 Left 10/19/21  Hip flexion 3+/5 3+/5  Hip extension    Hip abduction    Hip adduction    Hip internal rotation    Hip external rotation    Knee flexion    Knee extension 3/5 3/5  Ankle dorsiflexion 3/5 3/5  Ankle plantarflexion    Ankle inversion    Ankle eversion     (Blank rows = not tested)   SENSATION:  Diminished throughout both lower extremities  10/09/21 Treatment Manual Therapy Soft Tissue Mobilization: lumbar spine, STW/M to bilateral lumbar paraspinals to decrease pain and tone with pt positioned in left side-lying.  Pt struggles to get comfortable.                        Modalities  Date:  Unattended Estim: Lumbar, IFC 80-_0 , 20 mins, Pain and Tone Hot Pack: Lumbar, 20 mins, Pain and Tone  PATIENT EDUCATION: Education  details: symptoms, contacting her referring physician immediately, benefits of therapy after surgery Person educated: Patient Education method: Explanation and Demonstration Education comprehension: verbalized understanding   HOME EXERCISE PROGRAM: Contact her physician      PT Long Term Goals - 09/20/21 1653       PT LONG TERM GOAL #1   Title Independent with a HEP.    Time 6    Period Weeks    Status Not met     PT LONG TERM GOAL #2   Title Sit 30 minutes with pain not > 5/10.    Time 6    Period Weeks    Status Not met      PT LONG TERM GOAL #3   Title Stand 30 minutes with pain not > 5/10.    Time 6    Period Weeks    Status Not met     PT LONG TERM GOAL #4   Title Perform ADL's with pain not > 5/10.    Time 6    Period Weeks    Status Not met             Plan - 09/20/21 1654     Clinical Impression Statement Patient has made poor progress with skilled physical therapy as evidenced by her subjective reports and progress toward her goals. Her symptoms have progressed to include incontinence in recent weeks. She is being discharged at this time to follow up with her referring physician for further medical care to address her symptoms. Today's interventions were limited due to increased pain severity with today's interventions. She was educated on safety and the benefits of therapy after surgery.    Personal Factors and Comorbidities Other;Comorbidity 1    Comorbidities Chronic pain, h/o neck pain, h/o SIJ pain, Fibromyalgia.    Examination-Activity Limitations Locomotion Level;Other;Transfers;Bed Mobility;Stand;Sit    Stability/Clinical Decision Making Evolving/Moderate complexity    Rehab Potential Poor    PT Frequency 1x / week    PT Duration 6 weeks    PT Treatment/Interventions ADLs/Self Care Home Management;Cryotherapy;Electrical Stimulation;Ultrasound;Moist Heat;Functional mobility training;Therapeutic activities;Therapeutic exercise;Patient/family  education;Manual techniques;Passive range of motion    PT Next Visit Plan Modalities and STW/M, low-level core exercise progression.    Consulted and Agree with Plan of Care Patient            Darlin Coco, PT 10/24/2021, 1:43 PM

## 2021-12-31 ENCOUNTER — Other Ambulatory Visit: Payer: Self-pay | Admitting: Family Medicine

## 2021-12-31 DIAGNOSIS — Z1231 Encounter for screening mammogram for malignant neoplasm of breast: Secondary | ICD-10-CM

## 2022-01-10 ENCOUNTER — Other Ambulatory Visit: Payer: Self-pay | Admitting: Neurological Surgery

## 2022-01-10 DIAGNOSIS — M461 Sacroiliitis, not elsewhere classified: Secondary | ICD-10-CM

## 2022-01-21 ENCOUNTER — Other Ambulatory Visit: Payer: No Typology Code available for payment source

## 2022-01-21 ENCOUNTER — Inpatient Hospital Stay: Admission: RE | Admit: 2022-01-21 | Payer: No Typology Code available for payment source | Source: Ambulatory Visit

## 2022-01-22 ENCOUNTER — Other Ambulatory Visit: Payer: No Typology Code available for payment source

## 2022-01-29 ENCOUNTER — Ambulatory Visit
Admission: RE | Admit: 2022-01-29 | Discharge: 2022-01-29 | Disposition: A | Discharge status: Home / Self Care | Payer: No Typology Code available for payment source | Source: Ambulatory Visit | Attending: Neurological Surgery | Admitting: Neurological Surgery

## 2022-01-29 DIAGNOSIS — M461 Sacroiliitis, not elsewhere classified: Secondary | ICD-10-CM

## 2022-01-29 MED ORDER — METHYLPREDNISOLONE ACETATE 40 MG/ML INJ SUSP (RADIOLOG
80.0000 mg | Freq: Once | INTRAMUSCULAR | Status: AC
  Administered 2022-01-29: 80 mg via INTRA_ARTICULAR

## 2022-01-29 MED ORDER — IOPAMIDOL (ISOVUE-M 200) INJECTION 41%
1.0000 mL | Freq: Once | INTRAMUSCULAR | Status: AC
  Administered 2022-01-29: 1 mL via INTRA_ARTICULAR

## 2022-01-29 NOTE — Discharge Instructions (Signed)

## 2022-02-21 ENCOUNTER — Ambulatory Visit: Payer: No Typology Code available for payment source

## 2022-02-22 ENCOUNTER — Ambulatory Visit: Payer: No Typology Code available for payment source

## 2022-02-25 ENCOUNTER — Ambulatory Visit
Admission: RE | Admit: 2022-02-25 | Discharge: 2022-02-25 | Disposition: A | Discharge status: Home / Self Care | Payer: No Typology Code available for payment source | Source: Ambulatory Visit | Attending: Family Medicine | Admitting: Family Medicine

## 2022-02-25 DIAGNOSIS — Z1231 Encounter for screening mammogram for malignant neoplasm of breast: Secondary | ICD-10-CM

## 2022-02-26 ENCOUNTER — Other Ambulatory Visit: Payer: Self-pay | Admitting: Family Medicine

## 2022-02-26 DIAGNOSIS — R928 Other abnormal and inconclusive findings on diagnostic imaging of breast: Secondary | ICD-10-CM

## 2022-03-07 ENCOUNTER — Encounter: Payer: Self-pay | Admitting: Physician Assistant

## 2022-03-07 DIAGNOSIS — M461 Sacroiliitis, not elsewhere classified: Secondary | ICD-10-CM

## 2022-03-12 ENCOUNTER — Other Ambulatory Visit: Payer: Self-pay | Admitting: Physician Assistant

## 2022-03-12 DIAGNOSIS — M461 Sacroiliitis, not elsewhere classified: Secondary | ICD-10-CM

## 2022-03-13 ENCOUNTER — Ambulatory Visit
Admission: RE | Admit: 2022-03-13 | Discharge: 2022-03-13 | Disposition: A | Discharge status: Home / Self Care | Payer: No Typology Code available for payment source | Source: Ambulatory Visit | Attending: Family Medicine | Admitting: Family Medicine

## 2022-03-13 DIAGNOSIS — R928 Other abnormal and inconclusive findings on diagnostic imaging of breast: Secondary | ICD-10-CM

## 2022-03-26 ENCOUNTER — Other Ambulatory Visit (HOSPITAL_COMMUNITY): Payer: Self-pay | Admitting: Physician Assistant

## 2022-03-26 DIAGNOSIS — M961 Postlaminectomy syndrome, not elsewhere classified: Secondary | ICD-10-CM

## 2022-03-27 ENCOUNTER — Ambulatory Visit
Admission: RE | Admit: 2022-03-27 | Discharge: 2022-03-27 | Disposition: A | Discharge status: Home / Self Care | Payer: No Typology Code available for payment source | Source: Ambulatory Visit | Attending: Physician Assistant | Admitting: Physician Assistant

## 2022-03-27 DIAGNOSIS — M461 Sacroiliitis, not elsewhere classified: Secondary | ICD-10-CM

## 2022-03-27 MED ORDER — METHYLPREDNISOLONE ACETATE 40 MG/ML INJ SUSP (RADIOLOG
80.0000 mg | Freq: Once | INTRAMUSCULAR | Status: AC
  Administered 2022-03-27: 80 mg via INTRA_ARTICULAR

## 2022-03-27 MED ORDER — IOPAMIDOL (ISOVUE-M 200) INJECTION 41%
1.0000 mL | Freq: Once | INTRAMUSCULAR | Status: AC
  Administered 2022-03-27: 1 mL via INTRA_ARTICULAR

## 2022-03-27 NOTE — Discharge Instructions (Signed)

## 2022-03-29 ENCOUNTER — Other Ambulatory Visit: Payer: No Typology Code available for payment source

## 2022-04-01 ENCOUNTER — Other Ambulatory Visit: Payer: No Typology Code available for payment source

## 2022-04-02 ENCOUNTER — Ambulatory Visit (HOSPITAL_COMMUNITY)
Admission: RE | Admit: 2022-04-02 | Discharge: 2022-04-02 | Disposition: A | Discharge status: Home / Self Care | Payer: No Typology Code available for payment source | Source: Ambulatory Visit | Attending: Physician Assistant | Admitting: Physician Assistant

## 2022-04-02 DIAGNOSIS — M961 Postlaminectomy syndrome, not elsewhere classified: Secondary | ICD-10-CM | POA: Insufficient documentation

## 2022-07-22 ENCOUNTER — Other Ambulatory Visit: Payer: Self-pay | Admitting: Physician Assistant

## 2022-07-22 DIAGNOSIS — M461 Sacroiliitis, not elsewhere classified: Secondary | ICD-10-CM

## 2022-08-20 ENCOUNTER — Inpatient Hospital Stay: Admission: RE | Admit: 2022-08-20 | Payer: No Typology Code available for payment source | Source: Ambulatory Visit

## 2022-08-27 ENCOUNTER — Ambulatory Visit
Admission: RE | Admit: 2022-08-27 | Discharge: 2022-08-27 | Disposition: A | Discharge status: Home / Self Care | Payer: 59 | Source: Ambulatory Visit | Attending: Physician Assistant | Admitting: Physician Assistant

## 2022-08-27 DIAGNOSIS — M461 Sacroiliitis, not elsewhere classified: Secondary | ICD-10-CM

## 2022-08-27 MED ORDER — IOPAMIDOL (ISOVUE-M 200) INJECTION 41%
1.0000 mL | Freq: Once | INTRAMUSCULAR | Status: AC
  Administered 2022-08-27: 1 mL via INTRA_ARTICULAR

## 2022-08-27 MED ORDER — METHYLPREDNISOLONE ACETATE 40 MG/ML INJ SUSP (RADIOLOG
80.0000 mg | Freq: Once | INTRAMUSCULAR | Status: AC
  Administered 2022-08-27: 80 mg via INTRA_ARTICULAR

## 2022-11-20 ENCOUNTER — Encounter: Payer: Self-pay | Admitting: Family Medicine

## 2022-12-04 ENCOUNTER — Ambulatory Visit: Payer: 59 | Admitting: Allergy & Immunology

## 2022-12-06 ENCOUNTER — Other Ambulatory Visit (HOSPITAL_COMMUNITY): Payer: Self-pay | Admitting: Orthopedic Surgery

## 2022-12-06 ENCOUNTER — Other Ambulatory Visit: Payer: Self-pay | Admitting: Obstetrics & Gynecology

## 2022-12-06 DIAGNOSIS — N644 Mastodynia: Secondary | ICD-10-CM

## 2022-12-06 DIAGNOSIS — M8700 Idiopathic aseptic necrosis of unspecified bone: Secondary | ICD-10-CM

## 2022-12-09 ENCOUNTER — Other Ambulatory Visit: Payer: Self-pay | Admitting: Obstetrics & Gynecology

## 2022-12-09 DIAGNOSIS — N644 Mastodynia: Secondary | ICD-10-CM

## 2022-12-17 ENCOUNTER — Ambulatory Visit (HOSPITAL_COMMUNITY): Payer: 59

## 2022-12-24 ENCOUNTER — Ambulatory Visit (HOSPITAL_COMMUNITY)
Admission: RE | Admit: 2022-12-24 | Discharge: 2022-12-24 | Disposition: A | Discharge status: Home / Self Care | Payer: 59 | Source: Ambulatory Visit | Attending: Orthopedic Surgery | Admitting: Orthopedic Surgery

## 2022-12-24 ENCOUNTER — Other Ambulatory Visit: Payer: 59

## 2022-12-24 DIAGNOSIS — M8700 Idiopathic aseptic necrosis of unspecified bone: Secondary | ICD-10-CM | POA: Diagnosis present

## 2023-01-17 ENCOUNTER — Ambulatory Visit: Payer: 59 | Admitting: Allergy & Immunology

## 2023-01-20 ENCOUNTER — Other Ambulatory Visit: Payer: 59

## 2023-01-28 NOTE — Progress Notes (Deleted)
  Cardiology Office Note:  .   Date:  01/28/2023  ID:  [name], DOB 03-May-1975, MRN [id number] PCP: Eartha Inch, MD  [facility] Providers Cardiologist:  Nona Dell, MD { Click to update primary MD,subspecialty MD or APP then REFRESH:1}   History of Present Illness: .   [name] is a 47 y.o. female with history of GERD who presents for the evaluation of palpitations at the request of Eartha Inch, MD.    ***{There is no content from the last Narrative History section.}    ROS: All other ROS reviewed and negative. Pertinent positives noted in the HPI.     Studies Reviewed: Marland Kitchen       Physical Exam:   VS:  There were no vitals taken for this visit.   Wt Readings from Last 3 Encounters:  04/17/21 145 lb (65.8 kg)  04/14/20 145 lb 1 oz (65.8 kg)  01/12/18 145 lb (65.8 kg)    GEN: Well nourished, well developed in no acute distress NECK: No JVD; No carotid bruits CARDIAC: ***RRR, no murmurs, rubs, gallops RESPIRATORY:  Clear to auscultation without rales, wheezing or rhonchi  ABDOMEN: Soft, non-tender, non-distended EXTREMITIES:  No edema; No deformity  ASSESSMENT AND PLAN: .   ***    {Are you ordering a CV Procedure (e.g. stress test, cath, DCCV, TEE, etc)?   Press F2        :[id number]}   Follow-up: No follow-ups on file.  Time Spent with Patient: I have spent a total of *** minutes caring for this patient today face to face, ordering and reviewing labs/tests, reviewing prior records/medical history, examining the patient, establishing an assessment and plan, communicating results/findings to the patient/family, and documenting in the medical record.   Signed, Lenna Gilford. Flora Lipps, MD, [facility]  [facility]  [address] [place], [place] [address] [phone]  9:41 PM

## 2023-01-29 ENCOUNTER — Ambulatory Visit: Payer: 59 | Admitting: Cardiovascular Disease

## 2023-01-29 DIAGNOSIS — R002 Palpitations: Secondary | ICD-10-CM

## 2023-01-29 DIAGNOSIS — R42 Dizziness and giddiness: Secondary | ICD-10-CM

## 2023-03-06 ENCOUNTER — Ambulatory Visit: Payer: 59

## 2023-03-06 ENCOUNTER — Ambulatory Visit
Admission: RE | Admit: 2023-03-06 | Discharge: 2023-03-06 | Disposition: A | Discharge status: Home / Self Care | Payer: 59 | Source: Ambulatory Visit | Attending: Obstetrics & Gynecology | Admitting: Obstetrics & Gynecology

## 2023-03-06 DIAGNOSIS — N644 Mastodynia: Secondary | ICD-10-CM

## 2023-04-04 ENCOUNTER — Other Ambulatory Visit: Payer: Self-pay | Admitting: Neurological Surgery

## 2023-04-04 DIAGNOSIS — M461 Sacroiliitis, not elsewhere classified: Secondary | ICD-10-CM

## 2023-04-08 ENCOUNTER — Encounter: Payer: Self-pay | Admitting: Physician Assistant

## 2023-04-28 ENCOUNTER — Other Ambulatory Visit: Payer: 59

## 2023-04-28 ENCOUNTER — Inpatient Hospital Stay: Admission: RE | Admit: 2023-04-28 | Payer: 59 | Source: Ambulatory Visit

## 2023-05-19 ENCOUNTER — Ambulatory Visit: Payer: 59 | Admitting: Physician Assistant

## 2023-05-26 ENCOUNTER — Ambulatory Visit
Admission: RE | Admit: 2023-05-26 | Discharge: 2023-05-26 | Disposition: A | Discharge status: Home / Self Care | Payer: 59 | Source: Ambulatory Visit | Attending: Neurological Surgery | Admitting: Neurological Surgery

## 2023-05-26 DIAGNOSIS — M461 Sacroiliitis, not elsewhere classified: Secondary | ICD-10-CM

## 2023-05-26 MED ORDER — IOPAMIDOL (ISOVUE-M 200) INJECTION 41%
10.0000 mL | Freq: Once | INTRAMUSCULAR | Status: AC
  Administered 2023-05-26: 1 mL via INTRA_ARTICULAR

## 2023-05-30 ENCOUNTER — Other Ambulatory Visit (HOSPITAL_COMMUNITY): Payer: Self-pay | Admitting: Neurological Surgery

## 2023-05-30 DIAGNOSIS — M5416 Radiculopathy, lumbar region: Secondary | ICD-10-CM

## 2023-06-05 ENCOUNTER — Ambulatory Visit (HOSPITAL_COMMUNITY)
Admission: RE | Admit: 2023-06-05 | Discharge: 2023-06-05 | Disposition: A | Discharge status: Home / Self Care | Source: Ambulatory Visit | Attending: Neurological Surgery | Admitting: Neurological Surgery

## 2023-06-05 DIAGNOSIS — M5416 Radiculopathy, lumbar region: Secondary | ICD-10-CM | POA: Diagnosis present

## 2023-06-05 MED ORDER — GADOBUTROL 1 MMOL/ML IV SOLN
6.5000 mL | Freq: Once | INTRAVENOUS | Status: AC | PRN
  Administered 2023-06-05: 6.5 mL via INTRAVENOUS

## 2023-07-31 ENCOUNTER — Ambulatory Visit: Admitting: Internal Medicine

## 2023-09-18 ENCOUNTER — Ambulatory Visit: Admitting: Internal Medicine

## 2023-12-16 ENCOUNTER — Other Ambulatory Visit: Payer: Self-pay | Admitting: Obstetrics & Gynecology

## 2023-12-16 DIAGNOSIS — Z1231 Encounter for screening mammogram for malignant neoplasm of breast: Secondary | ICD-10-CM

## 2023-12-30 ENCOUNTER — Other Ambulatory Visit: Payer: Self-pay | Admitting: Podiatry

## 2023-12-30 DIAGNOSIS — S86011A Strain of right Achilles tendon, initial encounter: Secondary | ICD-10-CM

## 2024-01-05 ENCOUNTER — Ambulatory Visit (HOSPITAL_COMMUNITY)
Admission: RE | Admit: 2024-01-05 | Discharge: 2024-01-05 | Disposition: A | Discharge status: Home / Self Care | Source: Ambulatory Visit | Attending: Podiatry | Admitting: Podiatry

## 2024-01-05 DIAGNOSIS — S86011A Strain of right Achilles tendon, initial encounter: Secondary | ICD-10-CM | POA: Insufficient documentation

## 2024-01-20 ENCOUNTER — Ambulatory Visit: Admitting: Podiatry

## 2024-01-22 ENCOUNTER — Telehealth: Payer: Self-pay | Admitting: Podiatry

## 2024-01-22 ENCOUNTER — Ambulatory Visit (INDEPENDENT_AMBULATORY_CARE_PROVIDER_SITE_OTHER)

## 2024-01-22 ENCOUNTER — Ambulatory Visit (INDEPENDENT_AMBULATORY_CARE_PROVIDER_SITE_OTHER): Admitting: Podiatry

## 2024-01-22 VITALS — Ht 63.0 in | Wt 145.0 lb

## 2024-01-22 DIAGNOSIS — M722 Plantar fascial fibromatosis: Secondary | ICD-10-CM

## 2024-01-22 DIAGNOSIS — M79671 Pain in right foot: Secondary | ICD-10-CM | POA: Diagnosis not present

## 2024-01-22 DIAGNOSIS — M62461 Contracture of muscle, right lower leg: Secondary | ICD-10-CM

## 2024-01-22 NOTE — Telephone Encounter (Signed)
 DOS- 01/26/2024  ENDOSCOPIC PLANTAR FASCIOTOMY RT- 70106 GASTROCNEMIUS RECESS RT- 72312  [name] EFFECTIVE DATE- 03/12/2023  DEDUCTIBLE- N/A OOP- $4000 REMAINING- $0 FAMILY DEDUCTIBLE- $3300 REMAINING- $0 FAMILY OOP- $8000 REMAINING- [phone] COINSURANCE- 20%  PER [employer] PORTAL, PRIOR AUTH FOR CPT CODES 70106 AND [phone] HAVE BEEN APPROVED FROM 01/26/2024-04/25/2024. AUTH# [id number]

## 2024-01-22 NOTE — Progress Notes (Signed)
 Subjective:  Patient ID: [name], female    DOB: [birth year],  MRN: [id number]  Chief Complaint  Patient presents with   Foot Pain    Rm 17 Patient is here for right foot pain. Pt state pain in right ankle and heel while resting and during activities. Pt has received injections, walking boot, cast, orthotics and new shoes with no relief. Patient would like a surgical consultation.    Discussed the use of AI scribe software for clinical note transcription with the patient, who gave verbal consent to proceed.  History of Present Illness [name] is a 49 year old female with hip necrosis and recent back surgery who presents with persistent heel pain.  She has been experiencing heel pain since February, which has not improved despite various treatments with multiple podiatrists. She has tried different types of shoes, including 'cloud shoes', and has received multiple corticosteroid injections and oral steroids, but these have not alleviated her symptoms. The pain is particularly severe in the mornings, making it difficult to walk, and it does not improve as the day progresses.  The pain is described as burning and located at the back of the heel, with burning sensations radiating through the heel, especially on the outside edge. The pain is exacerbated by pressure, such as when she places her foot on the ground. She has tried using night splints and socks to alleviate the pain, but these have caused calf cramps.  She has a history of back issues, having undergone surgery on August 18th for L4-L5 compression. The surgery did not alleviate her heel pain. She has also experienced a reaction to prednisone , breaking out in hives, and has been advised against excessive use of NSAIDs due to potential effects on her back fusion.  She has been using Aleve and ibuprofen  to manage her pain, but is concerned about the impact on her back. Despite these medications, she finds the heel pain  debilitating, limiting her ability to walk and perform daily activities.  She has a history of hip necrosis, which complicates her treatment options. She has had corticosteroid injections in her back, hips, and groin. She also reports that her foot makes popping and cracking noises, which is a new development.  No shooting pain but describes a pulling sensation in the tendon area. She also notes tightness from the calf down to the heel.      Objective:    Physical Exam VASCULAR: DP and PT pulse palpable. Foot is warm and well-perfused. Capillary fill time is brisk. DERMATOLOGIC: Normal skin turgor, texture, and temperature. No open lesions, rashes, or ulcerations. NEUROLOGIC: Normal sensation to light touch and pressure. No paresthesias. ORTHOPEDIC: Slight gastrocnemius equinus. Sharp pain on palpation of plantar medial band of plantar fascia at medial heel insertion and inferior heel pain. No shooting pain on tapping, pulling sensation noted. Smooth pain-free range of motion of all examined joints. No ecchymosis or bruising. No gross deformity.   No images are attached to the encounter.    Results RADIOLOGY Foot MRI: Thickening of plantar fascia approximately 6 mm in length and diameter, no tear of the peroneal tendons, tenosynovitis, no Achilles tendon tear (01/05/2024)   Assessment:   1. Gastrocnemius equinus of right lower extremity   2. Plantar fasciitis of right foot      Plan:  Patient was evaluated and treated and all questions answered.  Assessment and Plan Assessment & Plan Right plantar fasciitis with associated plantar fascial thickening Chronic right heel pain since  February, exacerbated by weight-bearing activities. MRI shows thickening of the plantar fascia approximately 6 mm in length and diameter. Pain is sharp and burning, particularly in the morning, consistent with plantar fasciitis. Differential includes nerve entrapment, but MRI findings support plantar  fasciitis. Previous conservative treatments, including steroids and orthotics, have been ineffective. Surgical intervention is considered due to persistent symptoms and failure of conservative management. - Scheduled endoscopic plantar fascia release surgery. - Will perform platelet injection during surgery to aid healing. - Will consider calf muscle lengthening during surgery if tightness persists under anesthesia.  Right gastrocnemius equinus Tightness in the right calf muscle, potentially contributing to plantar fasciitis symptoms. Muscle tightness may be exacerbated by pain and inflammation. Surgical lengthening of the calf muscle may be necessary if tightness persists during surgery. - Will assess calf muscle tightness during surgery and will consider lengthening if necessary.  Right foot tenosynovitis Inflammation of the tendons in the right foot, contributing to pain and discomfort. No tendon tear noted on MRI. Symptoms may be related to plantar fasciitis and gastrocnemius equinus.    Surgical plan:  Procedure: -right EPF, EGR, PRP injection  Location: -[facility]  Anesthesia plan: -sedation with regional block   Postoperative pain plan: - Tylenol  1000 mg every 6 hours, ibuprofen  600 mg every 6 hours, gabapentin 300 mg every 8 hours x5 days, oxycodone  5 mg 1-2 tabs every 6 hours only as needed  DVT prophylaxis: -ASA 81mg  BID  WB Restrictions / DME needs: -WBAT in boot she will bring     No follow-ups on file.

## 2024-01-22 NOTE — Telephone Encounter (Signed)
 Called and confirmed patient surgery date and that patient not on any GLP1 or blood thinning medications at this time. Patient pharmacy is correct in chart.

## 2024-01-26 ENCOUNTER — Other Ambulatory Visit: Payer: Self-pay | Admitting: Podiatry

## 2024-01-26 DIAGNOSIS — M722 Plantar fascial fibromatosis: Secondary | ICD-10-CM | POA: Diagnosis not present

## 2024-01-26 DIAGNOSIS — M62461 Contracture of muscle, right lower leg: Secondary | ICD-10-CM | POA: Diagnosis not present

## 2024-01-26 MED ORDER — IBUPROFEN 600 MG PO TABS: 600.0000 mg | ORAL_TABLET | Freq: Four times a day (QID) | ORAL | 0 refills | Status: AC | PRN

## 2024-01-26 MED ORDER — ACETAMINOPHEN 500 MG PO TABS: 1000.0000 mg | ORAL_TABLET | Freq: Four times a day (QID) | ORAL | 0 refills | Status: AC | PRN

## 2024-01-26 MED ORDER — OXYCODONE HCL 5 MG PO TABS: 5.0000 mg | ORAL_TABLET | ORAL | 0 refills | Status: AC | PRN

## 2024-01-29 ENCOUNTER — Telehealth: Payer: Self-pay

## 2024-01-29 NOTE — Telephone Encounter (Signed)
 Patient called stating that she had an allergic reaction to the plain Oxycodone  that was called in. She states that she has been itching and has hives. She has tried taking Benadryl  and Zyrtec for the itching with no relief. She stopped taking the plain Oxycodone  and has been taking the left over pain medication she received when she had her back surgery with no problem. That medications is Oxycodome/Acetaminophen  5/325mg . She wants to know if you want to change her medicationor call in the one she was taking for her back surgery? Patient also states that she has been having some bleeding from the heel and that the boot rubs on the spot with movement.The bleeding is not excessive,but just a small spot.

## 2024-01-30 ENCOUNTER — Encounter

## 2024-01-30 NOTE — Telephone Encounter (Signed)
   Patient called 01/30/2024 after hours for concern of numbness and tingling in her toes and that her foot falls asleep after surgery.  Surgery with Dr. Silva 01/26/2024.  Tried calling patient.  No answer.  Left a voicemail stating that intermittent numbness and tingling is not uncommon after surgery.  Leave dressings clean and dry.  If she has any additional questions she can reach back out to the after-hours on-call provider.  Otherwise follow-up appointment next week with Dr. Silva.  Thresa EMERSON Sar, DPM [facility]  Dr. Thresa EMERSON Sar, DPM    [address] [place], [place] [address]                Office [phone]  Fax [phone]

## 2024-02-03 ENCOUNTER — Encounter

## 2024-02-03 ENCOUNTER — Ambulatory Visit (INDEPENDENT_AMBULATORY_CARE_PROVIDER_SITE_OTHER): Admitting: Podiatry

## 2024-02-03 DIAGNOSIS — M722 Plantar fascial fibromatosis: Secondary | ICD-10-CM

## 2024-02-03 DIAGNOSIS — M62461 Contracture of muscle, right lower leg: Secondary | ICD-10-CM

## 2024-02-03 NOTE — Progress Notes (Signed)
  Subjective:  Patient ID: [name], female    DOB: 1975/11/02,  MRN: [id number]  Chief Complaint  Patient presents with   Routine Post Op    Rm2 POV 1 DOS11/17/2025 RT Endoscopic plantar fasciotomy     48 y.o. female returns for post-op check.  Was quite painful at first but has improved  Review of Systems: Negative except as noted in the HPI. Denies N/V/F/Ch.   Objective:  There were no vitals filed for this visit. There is no height or weight on file to calculate BMI. Constitutional Well developed. Well nourished.  Vascular Foot warm and well perfused. Capillary refill normal to all digits.  Calf is soft and supple, no posterior calf or knee pain, negative Homans' sign  Neurologic Normal speech. Oriented to person, place, and time. Epicritic sensation to light touch grossly present bilaterally.  Dermatologic Skin healing well without signs of infection. Skin edges well coapted without signs of infection.  Orthopedic: Tenderness to palpation noted about the surgical site.   Assessment:   1. Gastrocnemius equinus of right lower extremity   2. Plantar fasciitis of right foot    Plan:  Patient was evaluated and treated and all questions answered.  S/p foot surgery right -Progressing as expected post-operatively. -WB Status: WBAT in cam walker boot -Sutures: Plan removed in 2 weeks. -Medications: No refills required -Foot redressed.  May shower and bathe leg.  Referral for PT sent to begin shortly after the next visit.  No follow-ups on file.

## 2024-02-10 ENCOUNTER — Ambulatory Visit: Admitting: Podiatry

## 2024-02-10 DIAGNOSIS — M62461 Contracture of muscle, right lower leg: Secondary | ICD-10-CM

## 2024-02-10 DIAGNOSIS — M722 Plantar fascial fibromatosis: Secondary | ICD-10-CM

## 2024-02-12 NOTE — Progress Notes (Signed)
  Subjective:  Patient ID: [name], female    DOB: September 04, 1975,  MRN: [id number]  Chief Complaint  Patient presents with   Routine Post [name] 01/26/24 pt stated that she fell and landed on her surgical foot she stated that she has been having a lot of discomfort in the heel and arch of foot from the boot      48 y.o. female returns for post-op check.  Pain has improved since that happened  Review of Systems: Negative except as noted in the HPI. Denies N/V/F/Ch.   Objective:  There were no vitals filed for this visit. There is no height or weight on file to calculate BMI. Constitutional Well developed. Well nourished.  Vascular Foot warm and well perfused. Capillary refill normal to all digits.  Calf is soft and supple, no posterior calf or knee pain, negative Homans' sign  Neurologic Normal speech. Oriented to person, place, and time. Epicritic sensation to light touch grossly present bilaterally.  Dermatologic Skin healing well without signs of infection. Skin edges well coapted without signs of infection.  Orthopedic: Tenderness to palpation noted about the surgical site.  She has good 5 out of 5 strength in the Achilles tendon, there is no palpable dell or evidence of rupture.   Assessment:   1. Gastrocnemius equinus of right lower extremity   2. Plantar fasciitis of right foot    Plan:  Patient was evaluated and treated and all questions answered.  S/p foot surgery right Fortunately has not had any complication of her surgical site or damage to the Achilles tendon.  She has physical therapy scheduled starting in 2 weeks.  Sutures removed uneventfully.  Follow-up as scheduled at week 6.  No follow-ups on file.

## 2024-02-17 ENCOUNTER — Encounter

## 2024-02-24 ENCOUNTER — Other Ambulatory Visit: Payer: Self-pay

## 2024-02-24 ENCOUNTER — Ambulatory Visit: Attending: Podiatry | Admitting: Physical Therapy

## 2024-02-24 DIAGNOSIS — M25672 Stiffness of left ankle, not elsewhere classified: Secondary | ICD-10-CM

## 2024-02-24 DIAGNOSIS — M79672 Pain in left foot: Secondary | ICD-10-CM | POA: Diagnosis present

## 2024-02-24 DIAGNOSIS — M62831 Muscle spasm of calf: Secondary | ICD-10-CM | POA: Insufficient documentation

## 2024-02-24 DIAGNOSIS — M722 Plantar fascial fibromatosis: Secondary | ICD-10-CM | POA: Insufficient documentation

## 2024-02-24 DIAGNOSIS — M62461 Contracture of muscle, right lower leg: Secondary | ICD-10-CM | POA: Insufficient documentation

## 2024-02-24 NOTE — Therapy (Signed)
 OUTPATIENT PHYSICAL THERAPY LOWER EXTREMITY EVALUATION   Patient Name: [name] MRN: [id number] DOB:08-Jul-1975, 48 y.o., female Today's Date: 02/24/2024  END OF SESSION:  PT End of Session - 02/24/24 1509     Visit Number 1    Number of Visits 12    Date for Recertification  04/06/24    PT Start Time 0230    PT Stop Time 0314    PT Time Calculation (min) 44 min    Activity Tolerance Patient tolerated treatment well    Behavior During Therapy [facility] for tasks assessed/performed          Past Medical History:  Diagnosis Date   Anxiety    Arthritis    Chronic back pain    Chronic pain    Endometriosis    Fibromyalgia    History of seizures    Reportedly related to migraine headaches   Kidney stone    Migraine headache    Past Surgical History:  Procedure Laterality Date   APPENDECTOMY     CHOLECYSTECTOMY     VAGINAL HYSTERECTOMY     Patient Active Problem List   Diagnosis Date Noted   Herpes simplex 11/13/2017   Premenstrual tension syndrome 11/13/2017   Urinary incontinence 11/13/2017   Calculus of kidney 11/05/2017   Endometriosis 11/05/2017   Seizure (HCC) 11/05/2017   Primary fibromyalgia syndrome 11/05/2017   Intractable migraine with aura without status migrainosus 06/06/2017   Anxiety 01/09/2017   Headache 12/12/2016   Avascular necrosis of bones of both hips (HCC) 11/19/2014   Fibrositis 02/28/2014   Headache, migraine 02/28/2014   Fibromyalgia 02/28/2014   Migraines 02/28/2014   REFERRING PROVIDER: Juliene Medicine   REFERRING DIAG: Gastrocnemius equinus of right lower extremity.  Plantar fasciitis of right foot s/p surgery (Plantar fascia release and gastrocnemius muscle lengthening).       THERAPY DIAG:  Pain in left foot  Stiffness of left ankle, not elsewhere classified  Muscle spasm of calf  Rationale for Evaluation and Treatment: Rehabilitation  ONSET DATE: 01/26/24 (surgery).    SUBJECTIVE:   SUBJECTIVE STATEMENT: The  patient presents to the  clinic s/p plantar fascia release and gastrocnemius muscle lengthening performed on 01/26/24.  Sh wears her boot at night.  The boot had become very uncomfortable to walk in and caused a large blister on top of her foot (now healed).  She is very careful and walks slowly.  Her pain is rated at a low 1-2/10.  She is very motivated to improve.  She had a fall out of tub after her surgery but consulted with her surgeon and appears caused no harm.        PERTINENT HISTORY: H/o spinal pain and surgery.  PAIN:  Are you having pain? 1-2/10.    PRECAUTIONS: Other: per MD note:  Can begin gradual resistive exercises after 02/23/24, wbat in boot for 6 weeks after surgery and then transition to regular footwear.  Avoid lengthening of Achilles tendon for 8 weeks after surgery but may progress to neutral range of motion and strengthening concentric exercise until then, beyond then no restrictions.    RED FLAGS: None   WEIGHT BEARING RESTRICTIONS: No  FALLS:  Has patient fallen in last 6 months? No  LIVING ENVIRONMENT: Lives with: lives with their spouse Has following equipment at home: None  PLOF: Independent  PATIENT GOALS: Walk without pain.    OBJECTIVE:   PATIENT SURVEYS:  LEFS:  43/80.    EDEMA:  Circumferential: No edema  observed.    PALPATION: Hypertensive over dorsum of right foot where blister previously was.  Also hypersensitive over/around right calf incisional sites with trigger points noted.    LOWER EXTREMITY ROM:  Active right ankle dorsiflexion to neutral.    LOWER EXTREMITY MMT:  NT  GAIT: The patient is walking safely and cautiously with a flat-footed gait pattern.                                                                                                                                 TREATMENT DATE: HMP and low-level IFC at 80-150 Hz on 40% scan to patient affected right calf x 20 minutes.  Normal modality resposne following  removal of modality.    PATIENT EDUCATION:  Education details: Ankle ABC's. Person educated: Patient Education method: Medical Illustrator Education comprehension: verbalized understanding and returned demonstration  HOME EXERCISE PROGRAM: As above.    ASSESSMENT:  CLINICAL IMPRESSION: The patient presents to OPPT s/p plantar fascia release and gastrocnemius muscle lengthening performed on 01/26/24.  She is pleased with her progress thus far and is motivated to improve.  She exhibits active right ankle dorsiflexion to neutral.  She is walking slowly and cautiously with a flat-footed gait pattern.  She is hypertensive over dorsum of right foot where a blister (from the boot) previously was and  hypersensitive over/around right calf incisional sites with trigger points noted.  Her LEFS score is 43/80.   Patient will benefit from skilled physical therapy intervention to address pain and deficits.    OBJECTIVE IMPAIRMENTS: Abnormal gait, decreased ROM, increased muscle spasms, and pain.   ACTIVITY LIMITATIONS: carrying, lifting, and locomotion level  PARTICIPATION LIMITATIONS: meal prep, cleaning, and laundry  PERSONAL FACTORS: Time since onset of injury/illness/exacerbation are also affecting patient's functional outcome.   REHAB POTENTIAL: Excellent  CLINICAL DECISION MAKING: Stable/uncomplicated  EVALUATION COMPLEXITY: Low   GOALS: LONG TERM GOALS: Target date: 04/06/24.  Ind with a HEP.  Goal status: INITIAL  2.  Increase right ankle dorsiflexion to 6- 8 degrees to normalize the patient's gait pattern. Goal status: INITIAL  3.  Increase ankle strength to 4+ to 5/5 to increase stability for functional tasks.  Goal status: INITIAL  4.  Normalize gait pattern.  Goal status: INITIAL  5.  Perform ADL's with pain not > 2/10.  Goal status: INITIAL  6.  Improve LEFS score by at least 10 points.  Goal status: INITIAL   PLAN:  PT FREQUENCY: 2x/week  PT  DURATION: 6 weeks  PLANNED INTERVENTIONS: 97110-Therapeutic exercises, 97530- Therapeutic activity, [id number]- Neuromuscular re-education, 97535- Self Care, [id number]- Manual therapy, [id number]- Electrical stimulation (unattended), [id number]- Vasopneumatic device, 97035- Ultrasound, 02981- Parrafin, Patient/Family education, Cryotherapy, and Moist heat  PLAN FOR NEXT SESSION: Gentle STW/M to right calf, seated rockerboard limited to neutral dorsiflexion unto 8 weeks post-op, yellow theraband exercises.     Anela Bensman, PT 02/24/2024, 6:32 PM

## 2024-02-26 ENCOUNTER — Ambulatory Visit: Admitting: Physical Therapy

## 2024-03-02 ENCOUNTER — Ambulatory Visit: Admitting: *Deleted

## 2024-03-02 ENCOUNTER — Encounter: Payer: Self-pay | Admitting: *Deleted

## 2024-03-02 DIAGNOSIS — M62831 Muscle spasm of calf: Secondary | ICD-10-CM

## 2024-03-02 DIAGNOSIS — M79672 Pain in left foot: Secondary | ICD-10-CM | POA: Diagnosis not present

## 2024-03-02 DIAGNOSIS — M25672 Stiffness of left ankle, not elsewhere classified: Secondary | ICD-10-CM

## 2024-03-02 NOTE — Therapy (Signed)
 " OUTPATIENT PHYSICAL THERAPY LOWER EXTREMITY TREATMENT   Patient Name: [name] MRN: [id number] DOB:01-28-1976, 48 y.o., female Today's Date: 03/02/2024  END OF SESSION:  PT End of Session - 03/02/24 1528     Visit Number 2    Number of Visits 12    Date for Recertification  04/06/24    PT Start Time 1515    PT Stop Time 1605    PT Time Calculation (min) 50 min          Past Medical History:  Diagnosis Date   Anxiety    Arthritis    Chronic back pain    Chronic pain    Endometriosis    Fibromyalgia    History of seizures    Reportedly related to migraine headaches   Kidney stone    Migraine headache    Past Surgical History:  Procedure Laterality Date   APPENDECTOMY     CHOLECYSTECTOMY     VAGINAL HYSTERECTOMY     Patient Active Problem List   Diagnosis Date Noted   Herpes simplex 11/13/2017   Premenstrual tension syndrome 11/13/2017   Urinary incontinence 11/13/2017   Calculus of kidney 11/05/2017   Endometriosis 11/05/2017   Seizure (HCC) 11/05/2017   Primary fibromyalgia syndrome 11/05/2017   Intractable migraine with aura without status migrainosus 06/06/2017   Anxiety 01/09/2017   Headache 12/12/2016   Avascular necrosis of bones of both hips (HCC) 11/19/2014   Fibrositis 02/28/2014   Headache, migraine 02/28/2014   Fibromyalgia 02/28/2014   Migraines 02/28/2014   REFERRING PROVIDER: Juliene Medicine   REFERRING DIAG: Gastrocnemius equinus of right lower extremity.  Plantar fasciitis of right foot s/p surgery (Plantar fascia release and gastrocnemius muscle lengthening).       THERAPY DIAG:  Pain in left foot  Stiffness of left ankle, not elsewhere classified  Muscle spasm of calf  Rationale for Evaluation and Treatment: Rehabilitation  ONSET DATE: 01/26/24 (surgery).    SUBJECTIVE:   SUBJECTIVE STATEMENT: The patient presents to the  clinic s/p plantar fascia release and gastrocnemius muscle lengthening performed on 01/26/24. Sore  from eval. PERTINENT HISTORY: H/o spinal pain and surgery.  PAIN:  Are you having pain? 3-4/10.    PRECAUTIONS: Other: per MD note:  Can begin gradual resistive exercises after 02/23/24, wbat in boot for 6 weeks after surgery and then transition to regular footwear.  Avoid lengthening of Achilles tendon for 8 weeks after surgery but may progress to neutral range of motion and strengthening concentric exercise until then, beyond then no restrictions.    RED FLAGS: None   WEIGHT BEARING RESTRICTIONS: No  FALLS:  Has patient fallen in last 6 months? No  LIVING ENVIRONMENT: Lives with: lives with their spouse Has following equipment at home: None  PLOF: Independent  PATIENT GOALS: Walk without pain.    OBJECTIVE:   PATIENT SURVEYS:  LEFS:  43/80.    EDEMA:  Circumferential: No edema observed.    PALPATION: Hypertensive over dorsum of right foot where blister previously was.  Also hypersensitive over/around right calf incisional sites with trigger points noted.    LOWER EXTREMITY ROM:  Active right ankle dorsiflexion to neutral.    LOWER EXTREMITY MMT:  NT  GAIT: The patient is walking safely and cautiously with a flat-footed gait pattern.  TREATMENT DATE: Rocker board PF/DF to neutral Df x 4 mins and  side/side x 4 mins Yellotband 3x10 PF and eversion 3x10  Manual light STW to calf and plantar fascia as well as TPR to calf TP as tolerated  HMP and low-level IFC at 80-150 Hz on 40% scan to patient affected right calf and foot x 15 minutes.  Foot very sensitive still at heel. Put all 4 leads on calf next Rx.   PATIENT EDUCATION:  Education details: Ankle ABC's. Person educated: Patient Education method: Medical Illustrator Education comprehension: verbalized understanding and returned demonstration  HOME EXERCISE  PROGRAM: As above.    ASSESSMENT:  CLINICAL IMPRESSION: The patient presents to OPPT s/p plantar fascia release and gastrocnemius muscle lengthening performed on 01/26/24.  Rx focused on seated ankle AROM with neutral DF limitation,Tband resistance, as well as STW to calf and Plantar fascia. IFC to foot and calf. Heel still very sensitive to estim, but tolerated well on calf. OBJECTIVE IMPAIRMENTS: Abnormal gait, decreased ROM, increased muscle spasms, and pain.   ACTIVITY LIMITATIONS: carrying, lifting, and locomotion level  PARTICIPATION LIMITATIONS: meal prep, cleaning, and laundry  PERSONAL FACTORS: Time since onset of injury/illness/exacerbation are also affecting patient's functional outcome.   REHAB POTENTIAL: Excellent  CLINICAL DECISION MAKING: Stable/uncomplicated  EVALUATION COMPLEXITY: Low   GOALS: LONG TERM GOALS: Target date: 04/06/24.  Ind with a HEP.  Goal status: INITIAL  2.  Increase right ankle dorsiflexion to 6- 8 degrees to normalize the patient's gait pattern. Goal status: INITIAL  3.  Increase ankle strength to 4+ to 5/5 to increase stability for functional tasks.  Goal status: INITIAL  4.  Normalize gait pattern.  Goal status: INITIAL  5.  Perform ADL's with pain not > 2/10.  Goal status: INITIAL  6.  Improve LEFS score by at least 10 points.  Goal status: INITIAL   PLAN:  PT FREQUENCY: 2x/week  PT DURATION: 6 weeks  PLANNED INTERVENTIONS: 97110-Therapeutic exercises, 97530- Therapeutic activity, [id number]- Neuromuscular re-education, 97535- Self Care, 02859- Manual therapy, G0283- Electrical stimulation (unattended), 97016- Vasopneumatic device, 97035- Ultrasound, 02981- Parrafin, Patient/Family education, Cryotherapy, and Moist heat  PLAN FOR NEXT SESSION: Gentle STW/M to right calf, seated rockerboard limited to neutral dorsiflexion unto 8 weeks post-op, yellow theraband exercises.     Sameera Betton,CHRIS, PTA 03/02/2024, 5:42 PM  "

## 2024-03-08 ENCOUNTER — Ambulatory Visit
Admission: RE | Admit: 2024-03-08 | Discharge: 2024-03-08 | Disposition: A | Source: Ambulatory Visit | Attending: Obstetrics & Gynecology

## 2024-03-08 ENCOUNTER — Ambulatory Visit: Admitting: Podiatry

## 2024-03-08 VITALS — Ht 63.0 in | Wt 145.0 lb

## 2024-03-08 DIAGNOSIS — M722 Plantar fascial fibromatosis: Secondary | ICD-10-CM

## 2024-03-08 DIAGNOSIS — Z1231 Encounter for screening mammogram for malignant neoplasm of breast: Secondary | ICD-10-CM

## 2024-03-08 DIAGNOSIS — M62461 Contracture of muscle, right lower leg: Secondary | ICD-10-CM

## 2024-03-09 ENCOUNTER — Other Ambulatory Visit: Payer: Self-pay | Admitting: Lab

## 2024-03-09 ENCOUNTER — Encounter: Admitting: Podiatry

## 2024-03-09 ENCOUNTER — Ambulatory Visit: Admitting: Physical Therapy

## 2024-03-09 MED ORDER — GABAPENTIN 100 MG PO CAPS: 100.0000 mg | ORAL_CAPSULE | Freq: Every day | ORAL | 1 refills | Status: AC

## 2024-03-10 NOTE — Progress Notes (Signed)
"  °  Subjective:  Patient ID: [name], female    DOB: [birth year],  MRN: [id number]  Chief Complaint  Patient presents with   Post-op Follow-up    RM 23 Patient is here for post-op f/u. Pt states pain and a knot on the lateral side of the right foot. Pt states pain on and numbness on the dorsal aspect of the right foot. Patient has discontinued used of the walking boot ( using knee scooter)..     48 y.o. female returns for post-op check.  Starting to turn the corner improved some but she still has some difficulty walking  Review of Systems: Negative except as noted in the HPI. Denies N/V/F/Ch.   Objective:  There were no vitals filed for this visit. Body mass index is 25.69 kg/m. Constitutional Well developed. Well nourished.  Vascular Foot warm and well perfused. Capillary refill normal to all digits.  Calf is soft and supple, no posterior calf or knee pain, negative Homans' sign  Neurologic Normal speech. Oriented to person, place, and time. Epicritic sensation to light touch grossly present bilaterally.  Dermatologic Her incisions are well-healed and not hypertrophic  Orthopedic: She has a mild to moderate pain at the surgical site   Assessment:   1. Gastrocnemius equinus of right lower extremity   2. Plantar fasciitis of right foot     Plan:  Patient was evaluated and treated and all questions answered.  S/p foot surgery right Improved some since last visit still tender, expect physical therapy will be able to work most of this out.  Weightbearing as tolerated, she may begin to transition back to regular shoe gear as she tolerates as well.  We discussed using night splint to maintain the reduction of her equinus contracture as well as for comfort at night and this was dispensed today as well.  Follow-up with me in 4 weeks for reevaluation.  No follow-ups on file.  "

## 2024-03-15 ENCOUNTER — Ambulatory Visit: Attending: Podiatry | Admitting: Physical Therapy

## 2024-03-15 DIAGNOSIS — M79672 Pain in left foot: Secondary | ICD-10-CM | POA: Insufficient documentation

## 2024-03-15 DIAGNOSIS — M62831 Muscle spasm of calf: Secondary | ICD-10-CM | POA: Insufficient documentation

## 2024-03-15 DIAGNOSIS — M25672 Stiffness of left ankle, not elsewhere classified: Secondary | ICD-10-CM | POA: Diagnosis present

## 2024-03-15 NOTE — Therapy (Signed)
 " OUTPATIENT PHYSICAL THERAPY LOWER EXTREMITY TREATMENT   Patient Name: [name] MRN: [id number] DOB:[birth year], 49 y.o., female Today's Date: 03/15/2024  END OF SESSION:  PT End of Session - 03/15/24 1312     Visit Number 3    Number of Visits 12    Date for Recertification  04/06/24    PT Start Time 1308    PT Stop Time 1346    PT Time Calculation (min) 38 min    Activity Tolerance Patient tolerated treatment well           Past Medical History:  Diagnosis Date   Anxiety    Arthritis    Chronic back pain    Chronic pain    Endometriosis    Fibromyalgia    History of seizures    Reportedly related to migraine headaches   Kidney stone    Migraine headache    Past Surgical History:  Procedure Laterality Date   APPENDECTOMY     CHOLECYSTECTOMY     VAGINAL HYSTERECTOMY     Patient Active Problem List   Diagnosis Date Noted   Herpes simplex 11/13/2017   Premenstrual tension syndrome 11/13/2017   Urinary incontinence 11/13/2017   Calculus of kidney 11/05/2017   Endometriosis 11/05/2017   Seizure (HCC) 11/05/2017   Primary fibromyalgia syndrome 11/05/2017   Intractable migraine with aura without status migrainosus 06/06/2017   Anxiety 01/09/2017   Headache 12/12/2016   Avascular necrosis of bones of both hips (HCC) 11/19/2014   Fibrositis 02/28/2014   Headache, migraine 02/28/2014   Fibromyalgia 02/28/2014   Migraines 02/28/2014   REFERRING PROVIDER: Juliene Medicine   REFERRING DIAG: Gastrocnemius equinus of right lower extremity.  Plantar fasciitis of right foot s/p surgery (Plantar fascia release and gastrocnemius muscle lengthening).       THERAPY DIAG:  No diagnosis found.  Rationale for Evaluation and Treatment: Rehabilitation  ONSET DATE: 01/26/24 (surgery).    SUBJECTIVE:   SUBJECTIVE STATEMENT: Pt states her arch was aching last night.   PERTINENT HISTORY: H/o spinal pain and surgery.  PAIN:  Are you having pain? 3-4/10.     PRECAUTIONS: Other: per MD note:  Can begin gradual resistive exercises after 02/23/24, wbat in boot for 6 weeks (03/08/24) after surgery and then transition to regular footwear.  Avoid lengthening of Achilles tendon for 8 (03/22/24) weeks after surgery but may progress to neutral range of motion and strengthening concentric exercise until then, beyond then no restrictions.    RED FLAGS: None   WEIGHT BEARING RESTRICTIONS: No  FALLS:  Has patient fallen in last 6 months? No  LIVING ENVIRONMENT: Lives with: lives with their spouse Has following equipment at home: None  PLOF: Independent  PATIENT GOALS: Walk without pain.    OBJECTIVE:   PATIENT SURVEYS:  LEFS:  43/80.    EDEMA:  Circumferential: No edema observed.    PALPATION: Hypertensive over dorsum of right foot where blister previously was.  Also hypersensitive over/around right calf incisional sites with trigger points noted.    LOWER EXTREMITY ROM:  Active right ankle dorsiflexion to neutral.    LOWER EXTREMITY MMT:  NT  GAIT: The patient is walking safely and cautiously with a flat-footed gait pattern.  TREATMENT DATE: 03/15/24 Sitting heel slide x 3 min Sitting ankle pump DF to neutral x1 min Sitting towel scrunch x1 min Sitting ankle inv/ev x1 min Sitting ankle 4 way yellow TB x1 min Sitting toe yoga x1 min Manual therapy: Myofacial release with IASTM, STM & TPR R calf   03/02/24 Rocker board PF/DF to neutral Df x 4 mins and  side/side x 4 mins Yellotband 3x10 PF and eversion 3x10  Manual light STW to calf and plantar fascia as well as TPR to calf TP as tolerated  HMP and low-level IFC at 80-150 Hz on 40% scan to patient affected right calf and foot x 15 minutes.  Foot very sensitive still at heel. Put all 4 leads on calf next Rx.   PATIENT EDUCATION:  Education details:  Ankle ABC's. Person educated: Patient Education method: Medical Illustrator Education comprehension: verbalized understanding and returned demonstration  HOME EXERCISE PROGRAM: As above.    ASSESSMENT:  CLINICAL IMPRESSION: The patient presents to OPPT s/p plantar fascia release and gastrocnemius muscle lengthening performed on 01/26/24.  Rx focused on seated ankle AROM with neutral DF limitation,Tband resistance, as well as STW to calf and Plantar fascia. IFC to foot and calf. Heel still very sensitive to estim, but tolerated well on calf.  OBJECTIVE IMPAIRMENTS: Abnormal gait, decreased ROM, increased muscle spasms, and pain.   ACTIVITY LIMITATIONS: carrying, lifting, and locomotion level  PARTICIPATION LIMITATIONS: meal prep, cleaning, and laundry  PERSONAL FACTORS: Time since onset of injury/illness/exacerbation are also affecting patient's functional outcome.   REHAB POTENTIAL: Excellent  CLINICAL DECISION MAKING: Stable/uncomplicated  EVALUATION COMPLEXITY: Low   GOALS: LONG TERM GOALS: Target date: 04/06/24.  Ind with a HEP.  Goal status: INITIAL  2.  Increase right ankle dorsiflexion to 6- 8 degrees to normalize the patient's gait pattern. Goal status: INITIAL  3.  Increase ankle strength to 4+ to 5/5 to increase stability for functional tasks.  Goal status: INITIAL  4.  Normalize gait pattern.  Goal status: INITIAL  5.  Perform ADL's with pain not > 2/10.  Goal status: INITIAL  6.  Improve LEFS score by at least 10 points.  Goal status: INITIAL   PLAN:  PT FREQUENCY: 2x/week  PT DURATION: 6 weeks  PLANNED INTERVENTIONS: 97110-Therapeutic exercises, 97530- Therapeutic activity, [id number]- Neuromuscular re-education, 97535- Self Care, 02859- Manual therapy, [id number]- Electrical stimulation (unattended), 97016- Vasopneumatic device, 97035- Ultrasound, 02981- Parrafin, Patient/Family education, Cryotherapy, and Moist heat  PLAN FOR NEXT SESSION:  Gentle STW/M to right calf, seated rockerboard limited to neutral dorsiflexion unto 8 weeks post-op, yellow theraband exercises.     Kyria Bumgardner April Ma L Clive Parcel, PT, DPT 03/15/2024, 2:38 PM  "

## 2024-03-18 ENCOUNTER — Ambulatory Visit: Admitting: Physical Therapy

## 2024-03-18 DIAGNOSIS — M25672 Stiffness of left ankle, not elsewhere classified: Secondary | ICD-10-CM

## 2024-03-18 DIAGNOSIS — M62831 Muscle spasm of calf: Secondary | ICD-10-CM

## 2024-03-18 DIAGNOSIS — M79672 Pain in left foot: Secondary | ICD-10-CM

## 2024-03-18 NOTE — Therapy (Signed)
 " OUTPATIENT PHYSICAL THERAPY LOWER EXTREMITY TREATMENT   Patient Name: [name] MRN: [id number] DOB:[birth year], 49 y.o., female Today's Date: 03/18/2024  END OF SESSION:  PT End of Session - 03/18/24 1305     Visit Number 4    Number of Visits 12    Date for Recertification  04/06/24    PT Start Time 1306    PT Stop Time 1345    PT Time Calculation (min) 39 min    Activity Tolerance Patient tolerated treatment well           Past Medical History:  Diagnosis Date   Anxiety    Arthritis    Chronic back pain    Chronic pain    Endometriosis    Fibromyalgia    History of seizures    Reportedly related to migraine headaches   Kidney stone    Migraine headache    Past Surgical History:  Procedure Laterality Date   APPENDECTOMY     CHOLECYSTECTOMY     VAGINAL HYSTERECTOMY     Patient Active Problem List   Diagnosis Date Noted   Herpes simplex 11/13/2017   Premenstrual tension syndrome 11/13/2017   Urinary incontinence 11/13/2017   Calculus of kidney 11/05/2017   Endometriosis 11/05/2017   Seizure (HCC) 11/05/2017   Primary fibromyalgia syndrome 11/05/2017   Intractable migraine with aura without status migrainosus 06/06/2017   Anxiety 01/09/2017   Headache 12/12/2016   Avascular necrosis of bones of both hips (HCC) 11/19/2014   Fibrositis 02/28/2014   Headache, migraine 02/28/2014   Fibromyalgia 02/28/2014   Migraines 02/28/2014   REFERRING PROVIDER: Juliene Medicine   REFERRING DIAG: Gastrocnemius equinus of right lower extremity.  Plantar fasciitis of right foot s/p surgery (Plantar fascia release and gastrocnemius muscle lengthening).       THERAPY DIAG:  No diagnosis found.  Rationale for Evaluation and Treatment: Rehabilitation  ONSET DATE: 01/26/24 (surgery).    SUBJECTIVE:   SUBJECTIVE STATEMENT: Pt states she still has those knots.   PERTINENT HISTORY: H/o spinal pain and surgery.  PAIN:  Are you having pain? 3-4/10.    PRECAUTIONS:  Other: per MD note:  Can begin gradual resistive exercises after 02/23/24, wbat in boot for 6 weeks (03/08/24) after surgery and then transition to regular footwear.  Avoid lengthening of Achilles tendon for 8 (03/22/24) weeks after surgery but may progress to neutral range of motion and strengthening concentric exercise until then, beyond then no restrictions.    RED FLAGS: None   WEIGHT BEARING RESTRICTIONS: No  FALLS:  Has patient fallen in last 6 months? No  LIVING ENVIRONMENT: Lives with: lives with their spouse Has following equipment at home: None  PLOF: Independent  PATIENT GOALS: Walk without pain.   Follow up: 04/06/24 with Dr. Medicine  OBJECTIVE:   PATIENT SURVEYS:  LEFS:  43/80.    EDEMA:  Circumferential: No edema observed.    PALPATION: Hypertensive over dorsum of right foot where blister previously was.  Also hypersensitive over/around right calf incisional sites with trigger points noted.    LOWER EXTREMITY ROM:  Active right ankle dorsiflexion to neutral.    LOWER EXTREMITY MMT:  NT  GAIT: The patient is walking safely and cautiously with a flat-footed gait pattern.  TREATMENT DATE: 03/18/24 Nustep L1 x10 min UEs/LEs only bending ankle to neutral Sitting ankle 4 way red TB x1 min each Sitting heel raise + ball squeeze x1 min Wall sits 10x5 Manual therapy: Myofacial release with IASTM, STM & TPR R calf  03/15/24 Sitting heel slide x 3 min Sitting ankle pump DF to neutral x1 min Sitting towel scrunch x1 min Sitting ankle inv/ev x1 min Sitting ankle 4 way yellow TB x1 min Sitting toe yoga x1 min Manual therapy: Myofacial release with IASTM, STM & TPR R calf   03/02/24 Rocker board PF/DF to neutral Df x 4 mins and  side/side x 4 mins Yellotband 3x10 PF and eversion 3x10  Manual light STW to calf and plantar fascia as  well as TPR to calf TP as tolerated  HMP and low-level IFC at 80-150 Hz on 40% scan to patient affected right calf and foot x 15 minutes.  Foot very sensitive still at heel. Put all 4 leads on calf next Rx.   PATIENT EDUCATION:  Education details: Ankle ABC's. Person educated: Patient Education method: Medical Illustrator Education comprehension: verbalized understanding and returned demonstration  HOME EXERCISE PROGRAM: As above.    ASSESSMENT:  CLINICAL IMPRESSION: Continued progressive strengthening as tolerated. Will be clear to do gastroc/soleus stretching after 03/22/24. Continues to have trigger points and tightness in her R calf addressed with manual therapy.   OBJECTIVE IMPAIRMENTS: Abnormal gait, decreased ROM, increased muscle spasms, and pain.   ACTIVITY LIMITATIONS: carrying, lifting, and locomotion level  PARTICIPATION LIMITATIONS: meal prep, cleaning, and laundry  PERSONAL FACTORS: Time since onset of injury/illness/exacerbation are also affecting patient's functional outcome.   REHAB POTENTIAL: Excellent  CLINICAL DECISION MAKING: Stable/uncomplicated  EVALUATION COMPLEXITY: Low   GOALS: LONG TERM GOALS: Target date: 04/06/24.  Ind with a HEP.  Goal status: INITIAL  2.  Increase right ankle dorsiflexion to 6- 8 degrees to normalize the patient's gait pattern. Goal status: INITIAL  3.  Increase ankle strength to 4+ to 5/5 to increase stability for functional tasks.  Goal status: INITIAL  4.  Normalize gait pattern.  Goal status: INITIAL  5.  Perform ADL's with pain not > 2/10.  Goal status: INITIAL  6.  Improve LEFS score by at least 10 points.  Goal status: INITIAL   PLAN:  PT FREQUENCY: 2x/week  PT DURATION: 6 weeks  PLANNED INTERVENTIONS: 97110-Therapeutic exercises, 97530- Therapeutic activity, [id number]- Neuromuscular re-education, 97535- Self Care, [id number]- Manual therapy, [id number]- Electrical stimulation (unattended), [id number]-  Vasopneumatic device, 97035- Ultrasound, [id number]- Parrafin, Patient/Family education, Cryotherapy, and Moist heat  PLAN FOR NEXT SESSION: Gentle STW/M to right calf, seated rockerboard limited to neutral dorsiflexion unto 8 weeks post-op, yellow theraband exercises.     Khriz Liddy April Ma L Gaetan Spieker, PT, DPT 03/18/2024, 1:06 PM  "

## 2024-03-23 ENCOUNTER — Ambulatory Visit

## 2024-03-25 ENCOUNTER — Ambulatory Visit: Admitting: *Deleted

## 2024-03-25 ENCOUNTER — Encounter: Payer: Self-pay | Admitting: *Deleted

## 2024-03-25 DIAGNOSIS — M62831 Muscle spasm of calf: Secondary | ICD-10-CM

## 2024-03-25 DIAGNOSIS — M79672 Pain in left foot: Secondary | ICD-10-CM

## 2024-03-25 DIAGNOSIS — M25672 Stiffness of left ankle, not elsewhere classified: Secondary | ICD-10-CM

## 2024-03-25 NOTE — Therapy (Signed)
 " OUTPATIENT PHYSICAL THERAPY LOWER EXTREMITY TREATMENT   Patient Name: [name] MRN: [id number] DOB:[birth year], 49 y.o., female Today's Date: 03/25/2024  END OF SESSION:  PT End of Session - 03/25/24 1532     Visit Number 5    Number of Visits 12    Date for Recertification  04/06/24    PT Start Time 1435    PT Stop Time 1523    PT Time Calculation (min) 48 min            Past Medical History:  Diagnosis Date   Anxiety    Arthritis    Chronic back pain    Chronic pain    Endometriosis    Fibromyalgia    History of seizures    Reportedly related to migraine headaches   Kidney stone    Migraine headache    Past Surgical History:  Procedure Laterality Date   APPENDECTOMY     CHOLECYSTECTOMY     VAGINAL HYSTERECTOMY     Patient Active Problem List   Diagnosis Date Noted   Herpes simplex 11/13/2017   Premenstrual tension syndrome 11/13/2017   Urinary incontinence 11/13/2017   Calculus of kidney 11/05/2017   Endometriosis 11/05/2017   Seizure (HCC) 11/05/2017   Primary fibromyalgia syndrome 11/05/2017   Intractable migraine with aura without status migrainosus 06/06/2017   Anxiety 01/09/2017   Headache 12/12/2016   Avascular necrosis of bones of both hips (HCC) 11/19/2014   Fibrositis 02/28/2014   Headache, migraine 02/28/2014   Fibromyalgia 02/28/2014   Migraines 02/28/2014   REFERRING PROVIDER: Juliene Medicine   REFERRING DIAG: Gastrocnemius equinus of right lower extremity.  Plantar fasciitis of right foot s/p surgery (Plantar fascia release and gastrocnemius muscle lengthening).       THERAPY DIAG:  Pain in left foot  Stiffness of left ankle, not elsewhere classified  Muscle spasm of calf  Rationale for Evaluation and Treatment: Rehabilitation  ONSET DATE: 01/26/24 (surgery).    SUBJECTIVE:   SUBJECTIVE STATEMENT: Pt reports calf and foot continue to hurt/ burn  PERTINENT HISTORY: H/o spinal pain and surgery.  PAIN:  Are you having  pain? 3-4/10.    PRECAUTIONS: Other: per MD note:  Can begin gradual resistive exercises after 02/23/24, wbat in boot for 6 weeks (03/08/24) after surgery and then transition to regular footwear.  Avoid lengthening of Achilles tendon for 8 (03/22/24) weeks after surgery but may progress to neutral range of motion and strengthening concentric exercise until then, beyond then no restrictions.    RED FLAGS: None   WEIGHT BEARING RESTRICTIONS: No  FALLS:  Has patient fallen in last 6 months? No  LIVING ENVIRONMENT: Lives with: lives with their spouse Has following equipment at home: None  PLOF: Independent  PATIENT GOALS: Walk without pain.   Follow up: 04/06/24 with Dr. Medicine  OBJECTIVE:   PATIENT SURVEYS:  LEFS:  43/80.    EDEMA:  Circumferential: No edema observed.    PALPATION: Hypertensive over dorsum of right foot where blister previously was.  Also hypersensitive over/around right calf incisional sites with trigger points noted.    LOWER EXTREMITY ROM:  Active right ankle dorsiflexion to neutral.    LOWER EXTREMITY MMT:  NT  GAIT: The patient is walking safely and cautiously with a flat-footed gait pattern.  TREATMENT DATE: 03/18/24 Nustep L1 x10 min UEs/LEs only bending ankle to neutral RT ankle active PF/ DF x 10  Red  tband df stretcing for gastroc and soleus   Manual therapy: Myofacial release with IASTM, STM & TPR R calf and achilles as well as plantar fascia, scar mobs  03/15/24 Sitting heel slide x 3 min Sitting ankle pump DF to neutral x1 min Sitting towel scrunch x1 min Sitting ankle inv/ev x1 min Sitting ankle 4 way yellow TB x1 min Sitting toe yoga x1 min Manual therapy: Myofacial release with IASTM, STM & TPR R calf   03/02/24 Rocker board PF/DF to neutral Df x 4 mins and  side/side x 4 mins Yellotband 3x10 PF and  eversion 3x10  Manual light STW to calf and plantar fascia as well as TPR to calf TP as tolerated  HMP and low-level IFC at 80-150 Hz on 40% scan to patient affected right calf and foot x 15 minutes.  Foot very sensitive still at heel. Put all 4 leads on calf next Rx.   PATIENT EDUCATION:  Education details: Ankle ABC's. Person educated: Patient Education method: Medical Illustrator Education comprehension: verbalized understanding and returned demonstration  HOME EXERCISE PROGRAM: As above.    ASSESSMENT:  CLINICAL IMPRESSION Pt arrived today and reports that she continues to have  RT calf pain/ nerve burning.Rx focused on manual STW as well as initiating light calf stretching with knee straight and bent.She Continues to have trigger points and tightness in her R calf with tenderness/ nerve pain.   OBJECTIVE IMPAIRMENTS: Abnormal gait, decreased ROM, increased muscle spasms, and pain.   ACTIVITY LIMITATIONS: carrying, lifting, and locomotion level  PARTICIPATION LIMITATIONS: meal prep, cleaning, and laundry  PERSONAL FACTORS: Time since onset of injury/illness/exacerbation are also affecting patient's functional outcome.   REHAB POTENTIAL: Excellent  CLINICAL DECISION MAKING: Stable/uncomplicated  EVALUATION COMPLEXITY: Low   GOALS: LONG TERM GOALS: Target date: 04/06/24.  Ind with a HEP.  Goal status: INITIAL  2.  Increase right ankle dorsiflexion to 6- 8 degrees to normalize the patient's gait pattern. Goal status: INITIAL  3.  Increase ankle strength to 4+ to 5/5 to increase stability for functional tasks.  Goal status: INITIAL  4.  Normalize gait pattern.  Goal status: INITIAL  5.  Perform ADL's with pain not > 2/10.  Goal status: INITIAL  6.  Improve LEFS score by at least 10 points.  Goal status: INITIAL   PLAN:  PT FREQUENCY: 2x/week  PT DURATION: 6 weeks  PLANNED INTERVENTIONS: 97110-Therapeutic exercises, 97530- Therapeutic activity,  [id number]- Neuromuscular re-education, 97535- Self Care, 02859- Manual therapy, [id number]- Electrical stimulation (unattended), 97016- Vasopneumatic device, 97035- Ultrasound, 02981- Parrafin, Patient/Family education, Cryotherapy, and Moist heat  PLAN FOR NEXT SESSION: Gentle STW/M to right calf, seated rockerboard limited to neutral dorsiflexion unto 8 weeks post-op, yellow theraband exercises.     Ephram Kornegay,CHRIS, PTA, DPT 03/25/2024, 6:03 PM  "

## 2024-03-31 ENCOUNTER — Ambulatory Visit

## 2024-03-31 DIAGNOSIS — M79672 Pain in left foot: Secondary | ICD-10-CM | POA: Diagnosis not present

## 2024-03-31 DIAGNOSIS — M62831 Muscle spasm of calf: Secondary | ICD-10-CM

## 2024-03-31 DIAGNOSIS — M25672 Stiffness of left ankle, not elsewhere classified: Secondary | ICD-10-CM

## 2024-03-31 NOTE — Therapy (Signed)
 " OUTPATIENT PHYSICAL THERAPY LOWER EXTREMITY TREATMENT   Patient Name: [name] MRN: [id number] DOB:[birth year], 49 y.o., female Today's Date: 03/31/2024  END OF SESSION:  PT End of Session - 03/31/24 1353     Visit Number 6    Number of Visits 12    Date for Recertification  04/06/24    PT Start Time 1345    PT Stop Time 1430    PT Time Calculation (min) 45 min            Past Medical History:  Diagnosis Date   Anxiety    Arthritis    Chronic back pain    Chronic pain    Endometriosis    Fibromyalgia    History of seizures    Reportedly related to migraine headaches   Kidney stone    Migraine headache    Past Surgical History:  Procedure Laterality Date   APPENDECTOMY     CHOLECYSTECTOMY     VAGINAL HYSTERECTOMY     Patient Active Problem List   Diagnosis Date Noted   Herpes simplex 11/13/2017   Premenstrual tension syndrome 11/13/2017   Urinary incontinence 11/13/2017   Calculus of kidney 11/05/2017   Endometriosis 11/05/2017   Seizure (HCC) 11/05/2017   Primary fibromyalgia syndrome 11/05/2017   Intractable migraine with aura without status migrainosus 06/06/2017   Anxiety 01/09/2017   Headache 12/12/2016   Avascular necrosis of bones of both hips (HCC) 11/19/2014   Fibrositis 02/28/2014   Headache, migraine 02/28/2014   Fibromyalgia 02/28/2014   Migraines 02/28/2014   REFERRING PROVIDER: Juliene Medicine   REFERRING DIAG: Gastrocnemius equinus of right lower extremity.  Plantar fasciitis of right foot s/p surgery (Plantar fascia release and gastrocnemius muscle lengthening).       THERAPY DIAG:  Pain in left foot  Stiffness of left ankle, not elsewhere classified  Muscle spasm of calf  Rationale for Evaluation and Treatment: Rehabilitation  ONSET DATE: 01/26/24 (surgery).    SUBJECTIVE:   SUBJECTIVE STATEMENT: Pt reports calf and foot continue to hurt/ burn.  PERTINENT HISTORY: H/o spinal pain and surgery.  PAIN:  Are you having  pain? 2-3/10.    PRECAUTIONS: Other: per MD note:  Can begin gradual resistive exercises after 02/23/24, wbat in boot for 6 weeks (03/08/24) after surgery and then transition to regular footwear.  Avoid lengthening of Achilles tendon for 8 (03/22/24) weeks after surgery but may progress to neutral range of motion and strengthening concentric exercise until then, beyond then no restrictions.    RED FLAGS: None   WEIGHT BEARING RESTRICTIONS: No  FALLS:  Has patient fallen in last 6 months? No  LIVING ENVIRONMENT: Lives with: lives with their spouse Has following equipment at home: None  PLOF: Independent  PATIENT GOALS: Walk without pain.   Follow up: 04/06/24 with Dr. Medicine  OBJECTIVE:   PATIENT SURVEYS:  LEFS:  43/80.    EDEMA:  Circumferential: No edema observed.    PALPATION: Hypertensive over dorsum of right foot where blister previously was.  Also hypersensitive over/around right calf incisional sites with trigger points noted.    LOWER EXTREMITY ROM:  Active right ankle dorsiflexion to neutral.    LOWER EXTREMITY MMT:  NT  GAIT: The patient is walking safely and cautiously with a flat-footed gait pattern.  TREATMENT DATE:   03/31/24                                 EXERCISE LOG  Exercise Repetitions and Resistance Comments  Nustep Lvl 3 x 15 mins   PF/DF 15 reps   DF stretching Red to tolerance        Blank cell = exercise not performed today   Manual Therapy Soft Tissue Mobilization: Right calf, Manual therapy: Myofacial release with IASTM, STM & TPR R calf and achilles as well as plantar fascia, scar mobs   03/18/24 Nustep L1 x10 min UEs/LEs only bending ankle to neutral RT ankle active PF/ DF x 10  Red  tband df stretcing for gastroc and soleus   Manual therapy: Myofacial release with IASTM, STM & TPR R calf and achilles as  well as plantar fascia, scar mobs  03/15/24 Sitting heel slide x 3 min Sitting ankle pump DF to neutral x1 min Sitting towel scrunch x1 min Sitting ankle inv/ev x1 min Sitting ankle 4 way yellow TB x1 min Sitting toe yoga x1 min Manual therapy: Myofacial release with IASTM, STM & TPR R calf   03/02/24 Rocker board PF/DF to neutral Df x 4 mins and  side/side x 4 mins Yellotband 3x10 PF and eversion 3x10  Manual light STW to calf and plantar fascia as well as TPR to calf TP as tolerated  HMP and low-level IFC at 80-150 Hz on 40% scan to patient affected right calf and foot x 15 minutes.  Foot very sensitive still at heel. Put all 4 leads on calf next Rx.   PATIENT EDUCATION:  Education details: Ankle ABC's. Person educated: Patient Education method: Medical Illustrator Education comprehension: verbalized understanding and returned demonstration  HOME EXERCISE PROGRAM: As above.    ASSESSMENT:  CLINICAL IMPRESSION Pt arrives for today's treatment session reporting 2-3/10 right calf pain.  Pt reports performed exercises and massage/STW at home to decrease calf tone.  IASTW performed to distal right calf to decrease pain and tone.  Pt reported decreased pain at completion of today's treatment session.   OBJECTIVE IMPAIRMENTS: Abnormal gait, decreased ROM, increased muscle spasms, and pain.   ACTIVITY LIMITATIONS: carrying, lifting, and locomotion level  PARTICIPATION LIMITATIONS: meal prep, cleaning, and laundry  PERSONAL FACTORS: Time since onset of injury/illness/exacerbation are also affecting patient's functional outcome.   REHAB POTENTIAL: Excellent  CLINICAL DECISION MAKING: Stable/uncomplicated  EVALUATION COMPLEXITY: Low   GOALS: LONG TERM GOALS: Target date: 04/06/24.  Ind with a HEP.  Goal status: INITIAL  2.  Increase right ankle dorsiflexion to 6- 8 degrees to normalize the patient's gait pattern. Goal status: INITIAL  3.  Increase ankle  strength to 4+ to 5/5 to increase stability for functional tasks.  Goal status: INITIAL  4.  Normalize gait pattern.  Goal status: INITIAL  5.  Perform ADL's with pain not > 2/10.  Goal status: INITIAL  6.  Improve LEFS score by at least 10 points.  Goal status: INITIAL   PLAN:  PT FREQUENCY: 2x/week  PT DURATION: 6 weeks  PLANNED INTERVENTIONS: 97110-Therapeutic exercises, 97530- Therapeutic activity, [id number]- Neuromuscular re-education, 97535- Self Care, 02859- Manual therapy, [id number]- Electrical stimulation (unattended), [id number]- Vasopneumatic device, 97035- Ultrasound, 02981- Parrafin, Patient/Family education, Cryotherapy, and Moist heat  PLAN FOR NEXT SESSION: Gentle STW/M to right calf, seated rockerboard limited to neutral dorsiflexion unto 8 weeks post-op, yellow theraband exercises.  Delon DELENA Gosling, PTA 03/31/2024, 2:36 PM  "

## 2024-04-06 ENCOUNTER — Ambulatory Visit: Admitting: Podiatry

## 2024-04-08 ENCOUNTER — Encounter: Payer: Self-pay | Admitting: *Deleted

## 2024-04-08 ENCOUNTER — Ambulatory Visit: Admitting: *Deleted

## 2024-04-08 DIAGNOSIS — M62831 Muscle spasm of calf: Secondary | ICD-10-CM

## 2024-04-08 DIAGNOSIS — M79672 Pain in left foot: Secondary | ICD-10-CM | POA: Diagnosis not present

## 2024-04-08 DIAGNOSIS — M25672 Stiffness of left ankle, not elsewhere classified: Secondary | ICD-10-CM

## 2024-04-08 NOTE — Therapy (Signed)
 " OUTPATIENT PHYSICAL THERAPY LOWER EXTREMITY TREATMENT   Patient Name: [name] MRN: [id number] DOB:[birth year], 49 y.o., female Today's Date: 04/08/2024  END OF SESSION:  PT End of Session - 04/08/24 1400     Visit Number 7    Number of Visits 12    Date for Recertification  04/06/24    PT Start Time 1345    PT Stop Time 1435    PT Time Calculation (min) 50 min            Past Medical History:  Diagnosis Date   Anxiety    Arthritis    Chronic back pain    Chronic pain    Endometriosis    Fibromyalgia    History of seizures    Reportedly related to migraine headaches   Kidney stone    Migraine headache    Past Surgical History:  Procedure Laterality Date   APPENDECTOMY     CHOLECYSTECTOMY     VAGINAL HYSTERECTOMY     Patient Active Problem List   Diagnosis Date Noted   Herpes simplex 11/13/2017   Premenstrual tension syndrome 11/13/2017   Urinary incontinence 11/13/2017   Calculus of kidney 11/05/2017   Endometriosis 11/05/2017   Seizure (HCC) 11/05/2017   Primary fibromyalgia syndrome 11/05/2017   Intractable migraine with aura without status migrainosus 06/06/2017   Anxiety 01/09/2017   Headache 12/12/2016   Avascular necrosis of bones of both hips (HCC) 11/19/2014   Fibrositis 02/28/2014   Headache, migraine 02/28/2014   Fibromyalgia 02/28/2014   Migraines 02/28/2014   REFERRING PROVIDER: Juliene Medicine   REFERRING DIAG: Gastrocnemius equinus of right lower extremity.  Plantar fasciitis of right foot s/p surgery (Plantar fascia release and gastrocnemius muscle lengthening).       THERAPY DIAG:  Pain in left foot  Stiffness of left ankle, not elsewhere classified  Muscle spasm of calf  Rationale for Evaluation and Treatment: Rehabilitation  ONSET DATE: 01/26/24 (surgery).    SUBJECTIVE:   SUBJECTIVE STATEMENT: Pt reports she is doing better overall. Calf and foot continue to hurt/ burn.  PERTINENT HISTORY: H/o spinal pain and  surgery.  PAIN:  Are you having pain? 2-3/10.    PRECAUTIONS: Other: per MD note:  Can begin gradual resistive exercises after 02/23/24, wbat in boot for 6 weeks (03/08/24) after surgery and then transition to regular footwear.  Avoid lengthening of Achilles tendon for 8 (03/22/24) weeks after surgery but may progress to neutral range of motion and strengthening concentric exercise until then, beyond then no restrictions.    RED FLAGS: None   WEIGHT BEARING RESTRICTIONS: No  FALLS:  Has patient fallen in last 6 months? No  LIVING ENVIRONMENT: Lives with: lives with their spouse Has following equipment at home: None  PLOF: Independent  PATIENT GOALS: Walk without pain.   Follow up: 04/06/24 with Dr. Medicine  OBJECTIVE:   PATIENT SURVEYS:  LEFS:  43/80.    EDEMA:  Circumferential: No edema observed.    PALPATION: Hypertensive over dorsum of right foot where blister previously was.  Also hypersensitive over/around right calf incisional sites with trigger points noted.    LOWER EXTREMITY ROM:  Active right ankle dorsiflexion to neutral.    LOWER EXTREMITY MMT:  NT  GAIT: The patient is walking safely and cautiously with a flat-footed gait pattern.  TREATMENT DATE:   04/08/24                                 EXERCISE LOG  Exercise Repetitions and Resistance Comments  Nustep Lvl 3 x 15 mins   Rocker board PF/DF . Calf stretch   PF/DF    DF stretching         Blank cell = exercise not performed today   Manual Therapy Soft Tissue Mobilization: Right calf, Manual therapy: Myofacial release with IASTM, STM & TPR R calf and achilles in prone as well as plantar fascia, scar mobs with Pt supine   03/18/24 Nustep L1 x10 min UEs/LEs only bending ankle to neutral RT ankle active PF/ DF x 10  Red  tband df stretcing for gastroc and soleus   Manual  therapy: Myofacial release with IASTM, STM & TPR R calf and achilles as well as plantar fascia, scar mobs  03/15/24 Sitting heel slide x 3 min Sitting ankle pump DF to neutral x1 min Sitting towel scrunch x1 min Sitting ankle inv/ev x1 min Sitting ankle 4 way yellow TB x1 min Sitting toe yoga x1 min Manual therapy: Myofacial release with IASTM, STM & TPR R calf   03/02/24 Rocker board PF/DF to neutral Df x 4 mins and  side/side x 4 mins Yellotband 3x10 PF and eversion 3x10  Manual light STW to calf and plantar fascia as well as TPR to calf TP as tolerated  HMP and low-level IFC at 80-150 Hz on 40% scan to patient affected right calf and foot x 15 minutes.  Foot very sensitive still at heel. Put all 4 leads on calf next Rx.   PATIENT EDUCATION:  Education details: Ankle ABC's. Person educated: Patient Education method: Medical Illustrator Education comprehension: verbalized understanding and returned demonstration  HOME EXERCISE PROGRAM: As above.    ASSESSMENT:  CLINICAL IMPRESSION Pt arrives for today's treatment session reporting that she is having less pain 2-3/10 right calf pain.  She was able to continue with therex f/b STW/   IASTW performed to distal right calf and plantar fascia to decrease pain and tone.  Pt reported decreased pain at completion of today's treatment session.   OBJECTIVE IMPAIRMENTS: Abnormal gait, decreased ROM, increased muscle spasms, and pain.   ACTIVITY LIMITATIONS: carrying, lifting, and locomotion level  PARTICIPATION LIMITATIONS: meal prep, cleaning, and laundry  PERSONAL FACTORS: Time since onset of injury/illness/exacerbation are also affecting patient's functional outcome.   REHAB POTENTIAL: Excellent  CLINICAL DECISION MAKING: Stable/uncomplicated  EVALUATION COMPLEXITY: Low   GOALS: LONG TERM GOALS: Target date: 04/06/24.  Ind with a HEP.  Goal status: MET  2.  Increase right ankle dorsiflexion to 6- 8 degrees to  normalize the patient's gait pattern. Goal status: On going  3.  Increase ankle strength to 4+ to 5/5 to increase stability for functional tasks.  Goal status: On going  4.  Normalize gait pattern.  Goal status: On going  5.  Perform ADL's with pain not > 2/10.  Goal status: On going  6.  Improve LEFS score by at least 10 points.  Goal status: On going   PLAN:  PT FREQUENCY: 2x/week  PT DURATION: 6 weeks  PLANNED INTERVENTIONS: 97110-Therapeutic exercises, 97530- Therapeutic activity, [id number]- Neuromuscular re-education, 97535- Self Care, [id number]- Manual therapy, [id number]- Electrical stimulation (unattended), 97016- Vasopneumatic device, 97035- Ultrasound, [id number]- Parrafin, Patient/Family education, Cryotherapy, and Moist heat  PLAN FOR NEXT  SESSION: Gentle STW/M to right calf, rockerboard , yellow theraband exercises.     Janson Lamar,CHRIS, PTA 04/08/2024, 5:14 PM  "

## 2024-04-13 ENCOUNTER — Ambulatory Visit: Payer: Self-pay | Admitting: *Deleted

## 2024-04-15 ENCOUNTER — Ambulatory Visit: Payer: Self-pay | Attending: Podiatry | Admitting: Physical Therapy

## 2024-04-15 DIAGNOSIS — M62831 Muscle spasm of calf: Secondary | ICD-10-CM

## 2024-04-15 DIAGNOSIS — M79672 Pain in left foot: Secondary | ICD-10-CM

## 2024-04-15 DIAGNOSIS — M25672 Stiffness of left ankle, not elsewhere classified: Secondary | ICD-10-CM

## 2024-04-15 NOTE — Therapy (Signed)
 " OUTPATIENT PHYSICAL THERAPY LOWER EXTREMITY TREATMENT   Patient Name: [name] MRN: [id number] DOB:26-Jan-1976, 49 y.o., female Today's Date: 04/15/2024  END OF SESSION:  PT End of Session - 04/15/24 1305     Visit Number 8    Number of Visits 12    Date for Recertification  04/06/24    PT Start Time 1302    PT Stop Time 1340    PT Time Calculation (min) 38 min    Activity Tolerance Patient tolerated treatment well            Past Medical History:  Diagnosis Date   Anxiety    Arthritis    Chronic back pain    Chronic pain    Endometriosis    Fibromyalgia    History of seizures    Reportedly related to migraine headaches   Kidney stone    Migraine headache    Past Surgical History:  Procedure Laterality Date   APPENDECTOMY     CHOLECYSTECTOMY     VAGINAL HYSTERECTOMY     Patient Active Problem List   Diagnosis Date Noted   Herpes simplex 11/13/2017   Premenstrual tension syndrome 11/13/2017   Urinary incontinence 11/13/2017   Calculus of kidney 11/05/2017   Endometriosis 11/05/2017   Seizure (HCC) 11/05/2017   Primary fibromyalgia syndrome 11/05/2017   Intractable migraine with aura without status migrainosus 06/06/2017   Anxiety 01/09/2017   Headache 12/12/2016   Avascular necrosis of bones of both hips (HCC) 11/19/2014   Fibrositis 02/28/2014   Headache, migraine 02/28/2014   Fibromyalgia 02/28/2014   Migraines 02/28/2014   REFERRING PROVIDER: Juliene Medicine   REFERRING DIAG: Gastrocnemius equinus of right lower extremity.  Plantar fasciitis of right foot s/p surgery (Plantar fascia release and gastrocnemius muscle lengthening).       THERAPY DIAG:  Pain in left foot  Stiffness of left ankle, not elsewhere classified  Muscle spasm of calf  Rationale for Evaluation and Treatment: Rehabilitation  ONSET DATE: 01/26/24 (surgery).    SUBJECTIVE:   SUBJECTIVE STATEMENT: Overall pain is decreasing. Can feel the nerve coming back into her  foot.   PERTINENT HISTORY: H/o spinal pain and surgery.  PAIN:  Are you having pain? 2-3/10.    PRECAUTIONS: Other: per MD note:  Can begin gradual resistive exercises after 02/23/24, wbat in boot for 6 weeks (03/08/24) after surgery and then transition to regular footwear.  Avoid lengthening of Achilles tendon for 8 (03/22/24) weeks after surgery but may progress to neutral range of motion and strengthening concentric exercise until then, beyond then no restrictions.    RED FLAGS: None   WEIGHT BEARING RESTRICTIONS: No  FALLS:  Has patient fallen in last 6 months? No  LIVING ENVIRONMENT: Lives with: lives with their spouse Has following equipment at home: None  PLOF: Independent  PATIENT GOALS: Walk without pain.   Follow up: 04/23/24 with Dr. Medicine  OBJECTIVE:   PATIENT SURVEYS:  LEFS:  43/80.    EDEMA:  Circumferential: No edema observed.    PALPATION: Hypertensive over dorsum of right foot where blister previously was.  Also hypersensitive over/around right calf incisional sites with trigger points noted.    LOWER EXTREMITY ROM: Active right ankle dorsiflexion to neutral.    LOWER EXTREMITY MMT: NT  GAIT: The patient is walking safely and cautiously with a flat-footed gait pattern.  TREATMENT DATE: 04/15/24 Nustep L5 x 15 min Standing gastroc stretch on slant board 2x30 Standing soleus stretch on slant board 2x30 Standing heel raise x10 Standing toe raise 2x10 Standing tandem stance 2x30 Side stepping airex beam x5 laps in // bars Side stepping with heels on airex beam x 5 laps in // bars Side stepping with toes on airex beam x 5 laps in // bars Curb walking on airex beam x 5 laps in // bars Sitting ankle ER green TB 2x10     04/08/24                                 EXERCISE LOG  Exercise Repetitions and Resistance  Comments  Nustep Lvl 3 x 15 mins   Rocker board PF/DF . Calf stretch   PF/DF    DF stretching         Blank cell = exercise not performed today   Manual Therapy Soft Tissue Mobilization: Right calf, Manual therapy: Myofacial release with IASTM, STM & TPR R calf and achilles in prone as well as plantar fascia, scar mobs with Pt supine   03/18/24 Nustep L1 x10 min UEs/LEs only bending ankle to neutral RT ankle active PF/ DF x 10  Red  tband df stretcing for gastroc and soleus   Manual therapy: Myofacial release with IASTM, STM & TPR R calf and achilles as well as plantar fascia, scar mobs  03/15/24 Sitting heel slide x 3 min Sitting ankle pump DF to neutral x1 min Sitting towel scrunch x1 min Sitting ankle inv/ev x1 min Sitting ankle 4 way yellow TB x1 min Sitting toe yoga x1 min Manual therapy: Myofacial release with IASTM, STM & TPR R calf   03/02/24 Rocker board PF/DF to neutral Df x 4 mins and  side/side x 4 mins Yellotband 3x10 PF and eversion 3x10  Manual light STW to calf and plantar fascia as well as TPR to calf TP as tolerated  HMP and low-level IFC at 80-150 Hz on 40% scan to patient affected right calf and foot x 15 minutes.  Foot very sensitive still at heel. Put all 4 leads on calf next Rx.   PATIENT EDUCATION:  Education details: Ankle ABC's. Person educated: Patient Education method: Medical Illustrator Education comprehension: verbalized understanding and returned demonstration  HOME EXERCISE PROGRAM: As above.    ASSESSMENT:  CLINICAL IMPRESSION Improving ankle/foot mobility. Session focused on improving standing weight shift and balance for uneven and compliant surfaces. Able to tolerate standing heel raises.   OBJECTIVE IMPAIRMENTS: Abnormal gait, decreased ROM, increased muscle spasms, and pain.   ACTIVITY LIMITATIONS: carrying, lifting, and locomotion level  PARTICIPATION LIMITATIONS: meal prep, cleaning, and laundry  PERSONAL  FACTORS: Time since onset of injury/illness/exacerbation are also affecting patient's functional outcome.   REHAB POTENTIAL: Excellent  CLINICAL DECISION MAKING: Stable/uncomplicated  EVALUATION COMPLEXITY: Low   GOALS: LONG TERM GOALS: Target date: 04/06/24.  Ind with a HEP.  Goal status: MET  2.  Increase right ankle dorsiflexion to 6- 8 degrees to normalize the patient's gait pattern. Goal status: On going  3.  Increase ankle strength to 4+ to 5/5 to increase stability for functional tasks.  Goal status: On going  4.  Normalize gait pattern.  Goal status: On going  5.  Perform ADL's with pain not > 2/10.  Goal status: On going  6.  Improve LEFS score by at least 10 points.  Goal status: On going   PLAN:  PT FREQUENCY: 2x/week  PT DURATION: 6 weeks  PLANNED INTERVENTIONS: 97110-Therapeutic exercises, 97530- Therapeutic activity, [id number]- Neuromuscular re-education, 97535- Self Care, 02859- Manual therapy, [id number]- Electrical stimulation (unattended), 97016- Vasopneumatic device, 97035- Ultrasound, 02981- Parrafin, Patient/Family education, Cryotherapy, and Moist heat  PLAN FOR NEXT SESSION: Gentle STW/M to right calf, rockerboard , yellow theraband exercises.     Aixa Corsello April Ma L Lavert Matousek, PT, DPT 04/15/2024, 1:42 PM  "

## 2024-04-20 ENCOUNTER — Ambulatory Visit: Admitting: Physical Therapy

## 2024-04-22 ENCOUNTER — Ambulatory Visit: Admitting: Physical Therapy

## 2024-04-27 ENCOUNTER — Ambulatory Visit: Admitting: Podiatry
